# Patient Record
Sex: Female | Born: 1970
Health system: Southern US, Community
[De-identification: ages and names within clinical notes are randomized; demographics above are authoritative.]

## PROBLEM LIST (undated history)

## (undated) DIAGNOSIS — N946 Dysmenorrhea, unspecified: Secondary | ICD-10-CM

## (undated) DIAGNOSIS — G43909 Migraine, unspecified, not intractable, without status migrainosus: Secondary | ICD-10-CM

## (undated) DIAGNOSIS — M1990 Unspecified osteoarthritis, unspecified site: Secondary | ICD-10-CM

## (undated) DIAGNOSIS — K219 Gastro-esophageal reflux disease without esophagitis: Secondary | ICD-10-CM

## (undated) DIAGNOSIS — A048 Other specified bacterial intestinal infections: Secondary | ICD-10-CM

## (undated) HISTORY — DX: Other specified bacterial intestinal infections: A04.8

## (undated) HISTORY — PX: NO PAST SURGERIES: SHX2092

## (undated) HISTORY — DX: Dysmenorrhea, unspecified: N94.6

## (undated) HISTORY — DX: Gastro-esophageal reflux disease without esophagitis: K21.9

---

## 2002-05-27 ENCOUNTER — Emergency Department (HOSPITAL_COMMUNITY): Admission: EM | Admit: 2002-05-27 | Discharge: 2002-05-28 | Payer: Self-pay | Admitting: Emergency Medicine

## 2002-06-04 ENCOUNTER — Emergency Department (HOSPITAL_COMMUNITY): Admission: EM | Admit: 2002-06-04 | Discharge: 2002-06-04 | Payer: Self-pay | Admitting: Emergency Medicine

## 2002-08-22 ENCOUNTER — Ambulatory Visit (HOSPITAL_COMMUNITY): Admission: RE | Admit: 2002-08-22 | Discharge: 2002-08-22 | Payer: Self-pay | Admitting: *Deleted

## 2003-01-08 ENCOUNTER — Inpatient Hospital Stay (HOSPITAL_COMMUNITY): Admission: AD | Admit: 2003-01-08 | Discharge: 2003-01-11 | Payer: Self-pay | Admitting: Family Medicine

## 2011-01-23 NOTE — Op Note (Signed)
   NAMELEYANNA, BITTMAN                         ACCOUNT NO.:  [id number]   MEDICAL RECORD NO.:  [id number]                   PATIENT TYPE:  INP   LOCATION:  9163                                 FACILITY:  [facility]   PHYSICIAN:  Phil D. Okey Dupre, M.D.                  DATE OF BIRTH:  1970-12-21   DATE OF PROCEDURE:  01/09/2003  DATE OF DISCHARGE:                                 OPERATIVE REPORT   PROCEDURE:  Outlet forceps delivery following assisted vacuum.   PREOPERATIVE DIAGNOSES:  Nonreassuring fetal heart pattern with tachycardia  in febrile patient.   POSTOPERATIVE DIAGNOSES:  Nonreassuring fetal heart pattern with tachycardia  in febrile patient.   REASON FOR DELIVERY:  After two and a half hours of second stage labor the  patient unable to bring the head down anymore at an ROT presentation at a +2  station with complete dilatation the vacuum extractor was placed just ahead  of the occiput posterior fontanelle and during contractions traction was  placed on which brought the head down in a transverse presentation but just  at the outlet continually popped off.  The head stayed in that position.  Luikart forceps were applied and the baby was turned to an ROA presentation  and easily delivered over a midline episiotomy which was repaired with a 2-0  chromic running suture.                                               Phil D. Okey Dupre, M.D.    PDR/MEDQ  D:  01/09/2003  T:  01/09/2003  Job:  [id number]

## 2011-02-13 ENCOUNTER — Emergency Department (HOSPITAL_BASED_OUTPATIENT_CLINIC_OR_DEPARTMENT_OTHER)
Admission: EM | Admit: 2011-02-13 | Discharge: 2011-02-13 | Disposition: A | Discharge status: Home / Self Care | Payer: Self-pay | Attending: Emergency Medicine | Admitting: Emergency Medicine

## 2011-02-13 DIAGNOSIS — R55 Syncope and collapse: Secondary | ICD-10-CM | POA: Insufficient documentation

## 2011-02-13 DIAGNOSIS — R42 Dizziness and giddiness: Secondary | ICD-10-CM | POA: Insufficient documentation

## 2011-02-13 LAB — URINALYSIS, ROUTINE W REFLEX MICROSCOPIC
Bilirubin Urine: NEGATIVE
Glucose, UA: NEGATIVE mg/dL
Hgb urine dipstick: NEGATIVE
Ketones, ur: >80 mg/dL — AB
Leukocytes, UA: NEGATIVE
Nitrite: NEGATIVE
Protein, ur: NEGATIVE mg/dL
Specific Gravity, Urine: 1.018 (ref 1.005–1.030)
Urobilinogen, UA: 0.2 mg/dL (ref 0.0–1.0)
pH: 7 (ref 5.0–8.0)

## 2011-02-13 LAB — BASIC METABOLIC PANEL
BUN: 8 mg/dL (ref 6–23)
CO2: 26 mEq/L (ref 19–32)
Calcium: 9.5 mg/dL (ref 8.4–10.5)
Chloride: 100 mEq/L (ref 96–112)
Creatinine, Ser: 0.5 mg/dL (ref 0.4–1.2)
GFR calc Af Amer: >60 mL/min (ref >60)
GFR calc non Af Amer: >60 mL/min (ref >60)
Glucose, Bld: 162 mg/dL — ABNORMAL HIGH (ref 70–99)
Potassium: 3.4 mEq/L — ABNORMAL LOW (ref 3.5–5.1)
Sodium: 135 mEq/L (ref 135–145)

## 2011-02-13 LAB — PREGNANCY, URINE: Preg Test, Ur: NEGATIVE

## 2011-04-08 ENCOUNTER — Encounter: Payer: Self-pay | Admitting: Advanced Practice Midwife

## 2011-04-08 ENCOUNTER — Ambulatory Visit (INDEPENDENT_AMBULATORY_CARE_PROVIDER_SITE_OTHER): Payer: Self-pay | Admitting: Advanced Practice Midwife

## 2011-04-08 DIAGNOSIS — N949 Unspecified condition associated with female genital organs and menstrual cycle: Secondary | ICD-10-CM

## 2011-04-08 DIAGNOSIS — N946 Dysmenorrhea, unspecified: Secondary | ICD-10-CM | POA: Insufficient documentation

## 2011-04-08 DIAGNOSIS — R102 Pelvic and perineal pain: Secondary | ICD-10-CM

## 2011-04-08 DIAGNOSIS — N938 Other specified abnormal uterine and vaginal bleeding: Secondary | ICD-10-CM

## 2011-04-08 LAB — TSH: TSH: 1.737 u[IU]/mL (ref 0.350–4.500)

## 2011-04-08 LAB — LUTEINIZING HORMONE: LH: 12.4 m[IU]/mL

## 2011-04-08 NOTE — Progress Notes (Signed)
Per Diane Day RN, hospital interpreter Rosey Bath used for check in and MD visit.

## 2011-04-08 NOTE — Patient Instructions (Signed)
It was nice to meet you.  I am sorry you have been having so much pain and irregular periods.  I am going to check some blood tests to make sure your hormone levels are normal.  Also, I am going to order an [place] of your ovaries and uterus.

## 2011-04-08 NOTE — Progress Notes (Addendum)
Subjective:     Sarah Andrews is a 40 y.o. woman who presents for irregular menses. Patient's last menstrual period was 03/26/2011. Periods are irregular, lasting various different lengths of days. Dysmenorrhea:severe, occurring throughout cycle. Cyclic symptoms include: none. Current contraception: none.History of infertility: yes - Pt has been trying to get pregnant for 2 years. History of abnormal Pap smear: no.  She complains that along with the irregular bleeding she has significant pelvic pain.  She says she has had all STD and pap smear testing already done at the [facility], all normal.  She says that the pain is not associated with her cycle, it can be at any time.  It happens nearly daily.  She says if feels like a stabbing or pulling pain.  She says it hurts with sneezing.  She has had constipation and bloating, but no other associate symptoms  The following portions of the patient's history were reviewed and updated as appropriate: allergies, current medications, past family history, past medical history, past social history, past surgical history and problem list.  Review of Systems Pertinent items are noted in HPI.     Objective:    BP 107/71  Pulse 72  Temp(Src) 97.8 F (36.6 C) (Oral)  Resp 20  Ht 5\' 2"  (1.575 m)  Wt 129 lb 1.6 oz (58.559 kg)  BMI 23.61 kg/m2  LMP 03/26/2011 General appearance: alert, cooperative and no distress Lungs: clear to auscultation bilaterally Heart: regular rate and rhythm, S1, S2 normal, no murmur, click, rub or gallop Abdomen: Soft, + BS, mild TTP, no organomegaly or masses.  Pelvic: cervix normal in appearance, external genitalia normal, no adnexal masses or tenderness, rectovaginal septum normal, uterus normal size, shape, and consistency, vagina normal without discharge and Mild cystocele and tenderness with internal examination.  Extremities: extremities normal, atraumatic, no cyanosis or edema Neurologic: Grossly normal    Assessment:    The  patient has Dysfunctional Uterine Bleeding and Pelvic pain.    Plan:   Will obtain TSH, FSH, and LH Order Abdominal US to rule out fibroids, ovarian masses.  Pt declines contraception for symptom control as she wishes to become pregnant.   Pt was seen by intern under my supervision, agree with assessment and plan

## 2011-04-09 ENCOUNTER — Ambulatory Visit (HOSPITAL_COMMUNITY)
Admission: RE | Admit: 2011-04-09 | Discharge: 2011-04-09 | Disposition: A | Discharge status: Home / Self Care | Payer: Self-pay | Source: Ambulatory Visit | Attending: Obstetrics and Gynecology | Admitting: Obstetrics and Gynecology

## 2011-04-09 DIAGNOSIS — R102 Pelvic and perineal pain: Secondary | ICD-10-CM

## 2011-04-09 DIAGNOSIS — N8300 Follicular cyst of ovary, unspecified side: Secondary | ICD-10-CM | POA: Insufficient documentation

## 2011-04-09 DIAGNOSIS — N949 Unspecified condition associated with female genital organs and menstrual cycle: Secondary | ICD-10-CM | POA: Insufficient documentation

## 2011-04-09 DIAGNOSIS — N938 Other specified abnormal uterine and vaginal bleeding: Secondary | ICD-10-CM

## 2011-04-21 ENCOUNTER — Telehealth: Payer: Self-pay | Admitting: *Deleted

## 2011-04-21 NOTE — Telephone Encounter (Signed)
Message left by Tobi Bastos RN from [facility] that patient had called there to get her [place] results info.

## 2011-04-22 NOTE — Telephone Encounter (Signed)
Called pt with [facility]- interpreter. Pt was informed that her pelvic US was normal. Pt asked why she could not get pregnant. I responded that she would need to speak to the doctor further about that and I could not answer her question.pt was informed that she could make an appt @ our infertility clinic if she desired. She was informed of $200 pmt @ first visit, $100 @ each successive visit and would be billed for any remaining balance. Pt voiced understanding and had no further questions.

## 2011-11-10 ENCOUNTER — Emergency Department (HOSPITAL_COMMUNITY)
Admission: EM | Admit: 2011-11-10 | Discharge: 2011-11-11 | Disposition: A | Discharge status: Home / Self Care | Payer: Self-pay | Attending: Emergency Medicine | Admitting: Emergency Medicine

## 2011-11-10 ENCOUNTER — Encounter (HOSPITAL_COMMUNITY): Payer: Self-pay | Admitting: *Deleted

## 2011-11-10 ENCOUNTER — Emergency Department (HOSPITAL_COMMUNITY): Payer: Self-pay

## 2011-11-10 DIAGNOSIS — O4690 Antepartum hemorrhage, unspecified, unspecified trimester: Secondary | ICD-10-CM | POA: Insufficient documentation

## 2011-11-10 DIAGNOSIS — N949 Unspecified condition associated with female genital organs and menstrual cycle: Secondary | ICD-10-CM | POA: Insufficient documentation

## 2011-11-10 DIAGNOSIS — O200 Threatened abortion: Secondary | ICD-10-CM | POA: Insufficient documentation

## 2011-11-10 LAB — URINE MICROSCOPIC-ADD ON

## 2011-11-10 LAB — URINALYSIS, ROUTINE W REFLEX MICROSCOPIC
Nitrite: NEGATIVE
Specific Gravity, Urine: 1.028 (ref 1.005–1.030)
Urobilinogen, UA: 0.2 mg/dL (ref 0.0–1.0)
pH: 6 (ref 5.0–8.0)

## 2011-11-10 LAB — CBC
MCH: 31.7 pg (ref 26.0–34.0)
MCV: 91.5 fL (ref 78.0–100.0)
Platelets: 284 10*3/uL (ref 150–400)
RDW: 12.5 % (ref 11.5–15.5)

## 2011-11-10 LAB — PREGNANCY, URINE: Preg Test, Ur: POSITIVE — AB

## 2011-11-10 LAB — DIFFERENTIAL
Basophils Absolute: 0 10*3/uL (ref 0.0–0.1)
Eosinophils Absolute: 0.1 10*3/uL (ref 0.0–0.7)
Eosinophils Relative: 1 % (ref 0–5)

## 2011-11-10 MED ORDER — SODIUM CHLORIDE 0.9 % IV SOLN
INTRAVENOUS | Status: DC
  Administered 2011-11-10: 23:00:00 via INTRAVENOUS

## 2011-11-10 NOTE — ED Provider Notes (Signed)
History     CSN: [id number]  Arrival date & time 11/10/11  2125   First MD Initiated Contact with Patient 11/10/11 2306      Chief Complaint  Patient presents with  . Vaginal Bleeding  . Possible Pregnancy    (Consider location/radiation/quality/duration/timing/severity/associated sxs/prior treatment) Patient is a 41 y.o. female presenting with vaginal bleeding. The history is provided by the patient and the spouse. No language interpreter was used.  Vaginal Bleeding This is a new problem. The current episode started yesterday. The problem occurs constantly. The problem has not changed since onset.Pertinent negatives include no chest pain, no abdominal pain, no headaches and no shortness of breath. The symptoms are aggravated by nothing. The symptoms are relieved by nothing. She has tried nothing for the symptoms. The treatment provided no relief.  Patient reports LMP was 1/28 and was on time and she had a normal on time period in December.  About 24 hours ago started having vaginal bleeding and passage of clots.  Had a positive pregnancy test.  No f/c/r  History reviewed. No pertinent past medical history.  History reviewed. No pertinent past surgical history.  Family History  Problem Relation Age of Onset  . Kidney disease Mother     dialysis  . Hypotension Mother     History  Substance Use Topics  . Smoking status: Current Some Day Smoker -- 1 years    Types: Cigarettes  . Smokeless tobacco: Not on file  . Alcohol Use:     OB History    Grav Para Term Preterm Abortions TAB SAB Ect Mult Living   1 1 1       1       Review of Systems  Constitutional: Negative.   HENT: Negative.   Eyes: Negative.   Respiratory: Negative for shortness of breath.   Cardiovascular: Negative for chest pain.  Gastrointestinal: Negative.  Negative for abdominal pain.  Genitourinary: Positive for vaginal bleeding and pelvic pain.  Neurological: Negative for headaches.  Hematological:  Negative.   Psychiatric/Behavioral: Negative.     Allergies  Review of patient's allergies indicates no known allergies.  Home Medications   Current Outpatient Rx  Name Route Sig Dispense Refill  . FOLIC ACID 1 MG PO TABS Oral Take 1 mg by mouth daily.      BP 113/72  Pulse 98  Temp(Src) 98.2 F (36.8 C) (Oral)  Resp 20  SpO2 98%  Physical Exam  Constitutional: She is oriented to person, place, and time. She appears well-developed and well-nourished.  HENT:  Head: Normocephalic and atraumatic.  Mouth/Throat: Oropharynx is clear and moist.  Eyes: Pupils are equal, round, and reactive to light.  Neck: Normal range of motion. Neck supple.  Cardiovascular: Normal rate and regular rhythm.   Pulmonary/Chest: Effort normal and breath sounds normal.  Abdominal: Soft. Bowel sounds are normal.  Genitourinary:       Chaperone present vaginal bleeding os closed  Musculoskeletal: Normal range of motion.  Neurological: She is alert and oriented to person, place, and time.  Skin: Skin is warm and dry.  Psychiatric: She has a normal mood and affect.    ED Course  Procedures (including critical care time)  Labs Reviewed  URINALYSIS, ROUTINE W REFLEX MICROSCOPIC - Abnormal; Notable for the following:    APPearance CLOUDY (*)    Hgb urine dipstick LARGE (*)    Leukocytes, UA SMALL (*)    All other components within normal limits  PREGNANCY, URINE - Abnormal; Notable  for the following:    Preg Test, Ur POSITIVE (*)    All other components within normal limits  CBC - Abnormal; Notable for the following:    WBC 11.0 (*)    All other components within normal limits  WET PREP, GENITAL - Abnormal; Notable for the following:    WBC, Wet Prep HPF POC FEW (*)    All other components within normal limits  URINE MICROSCOPIC-ADD ON - Abnormal; Notable for the following:    Bacteria, UA FEW (*)    All other components within normal limits  DIFFERENTIAL  HCG, QUANTITATIVE, PREGNANCY    ABO/RH  GC/CHLAMYDIA PROBE AMP, GENITAL   No results found.   No diagnosis found.    MDM  Suspect miscarriage with beta of 26 and cramping and bleeding.  No need for rhogam.  Patient has an appointment later today with an OB she should keep that appointment and discuss the results with them will need follow up beta HCG every 48 hours to assess decline.  Patient and husband verbalize understanding and agree to follow up       Shirrell Solinger K Hagen Tidd-Rasch, MD 11/11/11 [phone]

## 2011-11-10 NOTE — ED Notes (Signed)
Pt in c/o vaginal bleeding and abd cramping x1 day, states she had a positive pregnancy test this week, last menstrual cycle was 1/24, pt states she is bleeding heavily and passing clots

## 2011-11-11 LAB — GC/CHLAMYDIA PROBE AMP, GENITAL: Chlamydia, DNA Probe: NEGATIVE

## 2011-11-11 LAB — POCT I-STAT, CHEM 8
BUN: 9 mg/dL (ref 6–23)
Calcium, Ion: 1.15 mmol/L (ref 1.12–1.32)
HCT: 37 % (ref 36.0–46.0)
TCO2: 23 mmol/L (ref 0–100)

## 2011-11-11 LAB — HCG, QUANTITATIVE, PREGNANCY: hCG, Beta Chain, Quant, S: 26 m[IU]/mL — ABNORMAL HIGH (ref <5)

## 2011-11-11 NOTE — ED Notes (Signed)
MD at bedside. Dr. Palumbo at bedside.  

## 2011-11-11 NOTE — ED Notes (Signed)
Patient transported to US 

## 2011-11-11 NOTE — ED Notes (Signed)
Patient Returned from US 

## 2011-11-11 NOTE — Discharge Instructions (Signed)
Amenaza de aborto (Threatened Miscarriage) La hemorragia en las primeras 20 semanas de embarazo es algo frecuente. Se denomina amenaza de aborto Es un problema del embarazo que ocurre antes de la vigsima semana y que sugiere la probabilidad de que ocurra un aborto espontneo. Generalmente esta hemorragia se detiene con reposo o con disminucin de las actividades, segn le ha sugerido el profesional que la [place], y Firefighter contina sin [employer]. Le indicarn que no [employer], no tenga orgasmos ni use tampones hasta que la autoricen. En algunos casos la amenaza de aborto progresar hasta el aborto completo o incompleto. En algunos casos puede ser necesario un tratamiento adicional, en otros casos no. Algunos abortos ocurren antes que la mujer note que no ha [employer] y de que sepa que est embarazada. Un aborto ocurre en el 15% al 20% de todos los embarazos y generalmente durante las primeras 13 semanas. En la [employer] se desconoce la causa exacta. Es Biochemist, clinical en que la naturaliza pone fin a un embarazo anormal o que no llegar a trmino. Algunas de las cosas que ponen en riesgo el embarazo son:  Los cambios hormonales.   Infeccin o tumores en el tero.   Enfermedades crnicas, por ejemplo la diabetes, especialmente si no se ha controlado.   Forma anormal del tero.   Fibromas en el tero   Crvix incompetente (es demasiado dbil para contener al beb)   El consumo de cigarrillos.   Beber alcohol en exceso. Lo mejor es abstenerse de beber alcohol durante el embarazo.   El consumo de drogas  TRATAMIENTO No es necesario Education officer, environmental un tratamiento adicional cuando el aborto es completo y todos los componentes de la concepcin (todos los tejidos) se han eliminado. Si ha eliminado tejidos, consrvelos en un recipiente y llvelos al mdico para que los evale. Si el aborto es incompleto (partes del feto o de la placenta [place]) ser necesario un  tratamiento adicional. La razn ms frecuente para Education officer, environmental un tratamiento es el sangrado (hemorragia) continuo debido a que los tejidos no se han eliminado completamente. Esto sucede cuando el aborto es incompleto. Tambin puede [employer] tejidos que no se han expulsado se infecten. El tratamiento consiste en la dilatacin y Scientific laboratory technician (remocin de los productos del embarazo que pudieran quedar en el tero). Se realizar simplemente por medio de la succin (curetaje por succin) o por un raspado simple del interior del tero. Podr llevarse a cabo en el hospital o en el consultorio del profesional. Slo se lleva a cabo cuando el profesional se asegura que no hay posibilidades de que el embarazo llegue a trmino. Esto se determina por medio del examen fsico, la prueba de Psychiatrist negativa, el recuento hormonal y el ultrasonido que confirme la muerte del feto. El aborto generalmente es una situacin emocionalmente difcil para los [place]. No es por su culpa ni la de su pareja. No ocurre por conductas inapropiadas por parte suya o de su compaero. Casi todos los abortos se producen porque el embarazo ha comenzado de un modo incorrecto. Al menos la mitad de estos embarazos presenta anormalidades cromosmicas. Casi nunca se trata de un trastorno congnito. En otros puede haber problemas de desarrollo en el feto o en la placenta. Esto no siempre se revela, an cuando se estudien los productos del aborto con el microscopio. No se sienta culpable y probablemente no podra haber evitado que esto ocurra. Si tiene [employer] debido a este problema, convrselo con Mining engineer  y solicite ayuda psicolgico antes de un nuevo embarazo. Casi siempre puede tratar de embarazarse nuevamente tan pronto el profesional la autorice. INSTRUCCIONES PARA EL CUIDADO DOMICILIARIO  El Economist reposo, segn la importancia de la hemorragia y los dolores que [place]. Probablemente slo la autorice a  levantarse para ir al bao. Tambin podr autorizarla a Building surveyor. En este momento usted Economist algunos arreglos para que otra persona se ocupe del cuidado de los nios y de otras responsabilidades adicionales.   Lleve un registro de la cantidad y la saturacin de las toallas higinicas que Landscape architect. Anote esta informacin.   NO USE TAMPONES. No se haga duchas vaginales ni tenga relaciones sexuales u orgasmos hasta que el mdico la autorice.   Puede ser que le indiquen una cita para un seguimiento en el que volvern a Development worker, community el [place] de su Psychiatrist y Chief Executive Officer repetirn las pruebas de [place]. Concurra para una nueva evaluacin dentro de 2 das y [place] de 4 a 6 semanas. Es muy importante que realice el seguimiento en el momento que le han indicado.   Si su grupo sanguneo es Rh negativo y el del padre es Rh positivo, o si no conoce el grupo sanguneo del padre, le indicarn una inyeccin (inmunoglobulina Rh) para prevenir los anticuerpos anormales que pueden desarrollarse y Audiological scientist al beb en futuros embarazos.  SOLICITE ATENCIN MDICA DE INMEDIATO SI:  Siente calambres intensos en el estmago, en la espalda o en el abdomen.   Siente un dolor intenso de comienzo sbito en la zona inferior del abdomen.   Comienza a sentir escalofros.   La temperatura se eleva por encima de 101 F (38.3 C).   Elimina cogulos o tejidos grandes. Guarde una muestra de esos tejidos para que el profesional lo inspeccione.   La hemorragia aumenta o se siente mareada, dbil o tiene episodios de desmayos.   Tiene una prdida de lquido por la vagina.   Se desmaya. No puede mover el intestino. Podra tratarse de un embarazo ectpico.  Document Released: 06/03/2005 Document Revised: 08/13/2011 [employer] Patient Information 2012 [place], [place].

## 2011-11-17 ENCOUNTER — Encounter (HOSPITAL_COMMUNITY): Payer: Self-pay

## 2011-11-17 ENCOUNTER — Inpatient Hospital Stay (HOSPITAL_COMMUNITY)
Admission: AD | Admit: 2011-11-17 | Discharge: 2011-11-17 | Disposition: A | Discharge status: Home / Self Care | Payer: Self-pay | Source: Ambulatory Visit | Attending: Obstetrics & Gynecology | Admitting: Obstetrics & Gynecology

## 2011-11-17 DIAGNOSIS — O039 Complete or unspecified spontaneous abortion without complication: Secondary | ICD-10-CM | POA: Insufficient documentation

## 2011-11-17 LAB — HCG, QUANTITATIVE, PREGNANCY: hCG, Beta Chain, Quant, S: <1 m[IU]/mL (ref <5)

## 2011-11-17 NOTE — Progress Notes (Signed)
[facility] Department of Clinical Social Work Documentation of Interpretation   I assisted __Donna RN_________________ with interpretation of ____questions__________________ for this patient. 

## 2011-11-17 NOTE — MAU Note (Signed)
Patient states she was seen at [facility] ED a few days ago and told to come to MAU today for a repeat BHCG. Patient states she has had no bleeding in 3 days and having a slight back pain.

## 2011-11-17 NOTE — MAU Provider Note (Signed)
Attestation of Attending Supervision of Advanced Practitioner: Evaluation and management procedures were performed by the PA/NP/CNM/OB Fellow under my supervision/collaboration. Chart reviewed, and agree with management and plan.  Jaynie Collins, M.D. 11/17/2011 2:43 PM

## 2011-11-17 NOTE — Discharge Instructions (Signed)
Aborto espontneo (Miscarriage) Una interrupcin temprana del embarazo o aborto involuntario (aborto espontneo) es un problema frecuente. Generalmente ocurre cuando el embarazo no se desarrolla normalmente. Es muy improbable que usted o su pareja hayan hecho algo para que sucediera esto, aunque el humo del cigarrillo, las enfermedades de transmisin sexual, la ingesta excesiva de alcohol o abuso de drogas pueden aumentar el riesgo. Otras causas son:  Anormalidades del tero.   Problemas hormonales u otros trastornos.   Traumatismos y problemas genticos (cromosmicos).  Sufrir un aborto espontneo no modifica sus probabilidades de tener un embarazo normal en el futuro. El mdico le aconsejar cundo puede tratar de quedar embarazada otra vez. DESPUS DE UN ABORTO ESPONTNEO  Un aborto espontneo es inevitable cuando hay una hemorragia abundante y continua, clicos, dilatacin del cuello del tero o se eliminan tejidos del embarazo. La hemorragia y los clicos continuarn hasta que todos los tejidos se hayan retirado del tero.   A veces el tero no elimina completamente todos los tejidos, entonces es necesario administrar medicamentos o realizar una dilatacin y curetaje para retirar los restos de tejidos del embarazo. La dilatacin y curetaje raspa o succiona los tejidos para retirarlos.   Si usted es Rh negativa, usted puede necesitar tener inmunoglobulina Rh para evitar problemas de Rh.   Le darn medicamentos para combatir infecciones si el aborto fue provocado por un germen.  INSTRUCCIONES PARA EL CUIDADO DOMICILIARIO  Deber reposar durante los siguientes 2 a 3 das.   No tome baos de inmersin ni se haga duchas vaginales y no coloque nada en la vagina incluyendo tampones.   No mantenga relaciones sexuales hasta que su mdico lo autorice.   Evite los ejercicios o las actividades extenuantes hasta que su mdico lo autorice.   Guarde cualquier secrecin vaginal que pueda parecer un  tejido. Consulte al mdico si es necesario inspeccionar ese tejido.   Si usted o su pareja sufren culpa o duelo intenso, hable con su mdico para buscar la ayuda psicolgica que los ayude a enfrentar la prdida del embarazo.   Permtase el tiempo suficiente de duelo antes de quedar embarazada nuevamente.  SOLICITE ATENCIN MDICA DE INMEDIATO SI:  Observa una secrecin vaginal anormal, abundante o con mal olor.   Siente dolor abdominal o plvico continuo.   La temperatura oral le sube a ms de 102 F (38.9 C) y no puede bajarla con medicamentos.   Siente debilidad intensa, se desmaya, o sufre vmitos.   Comienza a sentir escalofros.   Es vctima de violencia familiar.  ASEGURESE DE QUE:  Comprende estas instrucciones.   Controlar su enfermedad.   Solicitar ayuda inmediatamente si no mejora o si empeora.  Document Released: 08/24/2005 Document Revised: 08/13/2011 [employer] Patient Information 2012 [employer]. 

## 2011-11-17 NOTE — MAU Provider Note (Signed)
History     CSN: [id number]  Arrival date & time 11/17/11  1054   None     Chief Complaint  Patient presents with  . Follow-up   HPI Sarah Andrews is a 41 y.o. female who presents to MAU for follow up blood work. She was evaluated at [facility] on 3/5 and had a Bhcg of 26. Her blood type is O positive. She reports no bleeding and only mild back pain since her last visit.  No past medical history on file.  No past surgical history on file.  Family History  Problem Relation Age of Onset  . Kidney disease Mother     dialysis  . Hypotension Mother     History  Substance Use Topics  . Smoking status: Current Some Day Smoker -- 1 years    Types: Cigarettes  . Smokeless tobacco: Not on file  . Alcohol Use:     OB History    Grav Para Term Preterm Abortions TAB SAB Ect Mult Living   2 1 1       1       Review of Systems: As stated in HPI  Allergies  Review of patient's allergies indicates no known allergies.  Home Medications  No current outpatient prescriptions on file.  BP 104/73  Pulse 84  Temp(Src) 99.7 F (37.6 C) (Oral)  Resp 16  Ht 5' (1.524 m)  Wt 134 lb 12.8 oz (61.145 kg)  BMI 26.33 kg/m2  SpO2 100%  LMP 10/05/2011  Physical Exam  Constitutional: She is oriented to person, place, and time.  HENT:  Head: Normocephalic.  Neck: Neck supple.  Cardiovascular: Normal rate.   Musculoskeletal: Normal range of motion.  Neurological: She is alert and oriented to person, place, and time.  Psychiatric: She has a normal mood and affect. Her behavior is normal. Judgment and thought content normal.   Results for orders placed during the hospital encounter of 11/17/11 (from the past 24 hour(s))  HCG, QUANTITATIVE, PREGNANCY     Status: Normal   Collection Time   11/17/11 11:32 AM      Component Value Range   hCG, Beta Chain, Quant, S <1  <5 (mIU/mL)   Assessment: Complete SAB  Plan:  Follow up with GYN Clinic   Return here as needed. ED Course    Procedures   MDM

## 2011-12-12 ENCOUNTER — Encounter (HOSPITAL_COMMUNITY): Payer: Self-pay | Admitting: Emergency Medicine

## 2011-12-12 ENCOUNTER — Emergency Department (HOSPITAL_COMMUNITY)
Admission: EM | Admit: 2011-12-12 | Discharge: 2011-12-12 | Disposition: A | Discharge status: Home / Self Care | Payer: Self-pay | Attending: Emergency Medicine | Admitting: Emergency Medicine

## 2011-12-12 DIAGNOSIS — F17200 Nicotine dependence, unspecified, uncomplicated: Secondary | ICD-10-CM | POA: Insufficient documentation

## 2011-12-12 DIAGNOSIS — G43909 Migraine, unspecified, not intractable, without status migrainosus: Secondary | ICD-10-CM | POA: Insufficient documentation

## 2011-12-12 MED ORDER — SODIUM CHLORIDE 0.9 % IV BOLUS (SEPSIS)
1000.0000 mL | Freq: Once | INTRAVENOUS | Status: AC
  Administered 2011-12-12: 1000 mL via INTRAVENOUS

## 2011-12-12 MED ORDER — DIPHENHYDRAMINE HCL 50 MG/ML IJ SOLN
25.0000 mg | Freq: Once | INTRAMUSCULAR | Status: AC
  Administered 2011-12-12: 25 mg via INTRAVENOUS
  Filled 2011-12-12: qty 1

## 2011-12-12 MED ORDER — KETOROLAC TROMETHAMINE 30 MG/ML IJ SOLN
30.0000 mg | Freq: Once | INTRAMUSCULAR | Status: AC
  Administered 2011-12-12: 30 mg via INTRAVENOUS
  Filled 2011-12-12: qty 1

## 2011-12-12 MED ORDER — METOCLOPRAMIDE HCL 5 MG/ML IJ SOLN
10.0000 mg | Freq: Once | INTRAMUSCULAR | Status: AC
  Administered 2011-12-12: 10 mg via INTRAVENOUS
  Filled 2011-12-12: qty 2

## 2011-12-12 MED ORDER — DEXAMETHASONE SODIUM PHOSPHATE 10 MG/ML IJ SOLN
10.0000 mg | Freq: Once | INTRAMUSCULAR | Status: AC
Start: 2011-12-12 — End: 2011-12-12
  Administered 2011-12-12: 10 mg via INTRAVENOUS
  Filled 2011-12-12: qty 1

## 2011-12-12 NOTE — Discharge Instructions (Signed)
RESOURCE GUIDE  Dental Problems  Patients with Medicaid: [facility]                     [address]                                           Phone:  [phone]                                                  If unable to pay or uninsured, contact:  [facility] or [facility] to become qualified for the adult dental clinic.  Chronic Pain Problems Contact [facility]  [phone] Patients need to be referred by their primary care doctor.  Insufficient Money for Medicine Contact [employer] Way:  call "211" or [facility] 271-5999.  No Primary Care Doctor Call [facility]  [phone] Other agencies that provide inexpensive medical care    [facility]  [phone]    [facility]  [phone]    [facility]  [phone]    [facility]  [phone]    [facility]  [phone]    [facility]  [phone]  Substance Abuse [facility]  336-882-2125 [facility] 336-784-9470 [facility] 336-285-9073 [facility] 336-845-3988 Residential & Outpatient Substance Abuse Program  800-659-3381  Psychological Services [facility]  832-9600 [facility]  [phone] [facility]   [phone] (emergency services [phone])  Abuse/Neglect [place] [facility] Hotline (336) 641-3795 [place] [facility] Hotline 800-378-5315 (After Hours)  Emergency Shelter [facility] (336) 271-5985  [facility] (336) 275-9566 [facility] (704) 372-4663  MRSA Hotline #:   832-7006    [place] [facility]  [facility]  [employer]                           [facility] [address] [place]                     [address]         [address]  [place]                                                [place]                              [place] Phone:  [phone]                                  Phone:  [phone]                   Phone:  [phone]  [facility] Phone:  [phone]  [place] Child Abuse Hotline (336) 342-1394 (336)   161-0960 (After Hours)  Cefalea migraosa (Migraine Headache) Una cefalea migraosa es un dolor de cabeza intenso y punzante en uno de los dos lados de la cabeza. No siempre se conoce la causa exacta. Una migraa puede producirse cuando los nervios del cerebro se irritant y liberan qumicos que producen inflamacin dentro de los vasos sanguneos y Teaching laboratory technician. Muchas personas que padecen cefaleas tienen una historia familiar de migraas. Antes de sufrir una cefalea migraosa podr o no tener un aura. Un aura es un grupo de sntomas que predicen el comienzo de la cefalea. Esto puede incluir:  Cambios visuales como:   Flashes de Patent examiner.   Puntos brillantes o lneas en zig-zag.   Visin en tnel.   Sensacin de hormigueo.   Problemas para hablar.   Debilitamiento muscular.  SNTOMAS   Dolor en uno o ambos lados de la cabeza.   Dolor punzante o con pulsaciones.   Dolor que es lo suficientemente grave en intensidad como para impedir las actividades habituales.   Se agrava por cualquier actividad fsica habitual.   Nuseas (ganas de vomitar), vmitos o ambos.   Dolor ante la exposicin a luces brillantes o a ruidos fuertes o con Agricultural engineer.   Sensibilidad general a luces brillantes o a ruidos fuertes.  DISPARADORES DE LA CEFALEA MIGRAOSA Algunos factores que desencadenan la migraa son:   Neal Dy alcohol.   El hbito de fumar.   El estrs.   Pueden estar relacionadas con el perodo menstrual.   Quesos estacionados.   Alimentos o bebidas que contienen nitratos, glutamato, aspartamo o tiramina.   La falta de sueo.   Chocolate.   Cafena.   Hambre.   Medicamentos como  nitroglicerina (se utiliza para tratar dolores en el pecho), pldoras anticonceptivas, estrgenos y algunos medicamentos para la presin sangunea.  DIAGNSTICO Una cefalea migraosa a menudo se diagnostica segn:  Sntomas.   Examen fsico.   Un estudio computarizado de rayos X (tomografa computarizada) de la cabeza.  TRATAMIENTO  Hay medicamentos que pueden prevenir estas cefaleas si son recurrentes o llegan a serlo en el futuro. El profesional que lo asiste lo ayudar a Horticulturist, commercial o el programa de tratamiento que puede ser de [place] para usted.   Acostarse en una habitacin oscura y silenciosa puede ser til.   Llevar un registro de los dolores de cabeza puede ayudarlo a Building services engineer los desencadenantes del Engineer, mining.  SOLICITE ATENCIN MDICA DE INMEDIATO SI:   Confusin, cambios en la personalidad que no son habituales, temblores.   El dolor lo despierta por las noches.   Presenta un aumento en la frecuencia de las cefaleas.   Presenta rigidez en el cuello.   Sufre prdida de la visin.   Siente debilidad muscular.   Comienza a perder el equilibrio o tiene problemas para caminar.   Sufre mareos o se desmaya.  ASEGRESE QUE:   Comprende estas instrucciones.   Controlar su enfermedad.   Solicitar ayuda de inmediato si no mejora o empeora.  Document Released: 08/24/2005 Document Revised: 08/13/2011 [facility] Patient Information 2012 [place], [place].

## 2011-12-12 NOTE — ED Notes (Addendum)
Pt c/o headache, dizziness & nase[place] X 3 days. Pt stated "she took ibuprofen and had no relief". Pt stated she often gets migraines around the same time as her period, she started her period yesterday.

## 2011-12-12 NOTE — ED Provider Notes (Signed)
History     CSN: [id number]  Arrival date & time 12/12/11  1209   First MD Initiated Contact with Patient 12/12/11 1236      HPI Patient reports migraine headaches that occur at the beginning of her periods. States most recently has developed 3 days ago at the start of her period this month. Describes pain as a throbbing pain across her forehead it in the back of her head. States pain is typical for her monthly headaches. Associated with dizziness, nausea, photophobia, phonophobia. Denies fever, sore throat, nasal congestion, neck pain, change in vision. Reports been able to control pain with ibuprofen. Patient is a 41 y.o. female presenting with migraine. The history is provided by the patient.  Migraine This is a recurrent problem. Episode onset: 3 days ago. The problem occurs constantly. The problem has been gradually worsening. Associated symptoms include headaches and nausea. Pertinent negatives include no abdominal pain, chest pain, chills, congestion, coughing, fatigue, fever, neck pain, numbness, rash, sore throat, vertigo, visual change, vomiting or weakness. Exacerbated by: bright lights and loud sounds. She has tried NSAIDs for the symptoms. The treatment provided no relief.    History reviewed. No pertinent past medical history.  History reviewed. No pertinent past surgical history.  Family History  Problem Relation Age of Onset  . Kidney disease Mother     dialysis  . Hypotension Mother     History  Substance Use Topics  . Smoking status: Current Some Day Smoker -- 1 years    Types: Cigarettes  . Smokeless tobacco: Not on file  . Alcohol Use:     OB History    Grav Para Term Preterm Abortions TAB SAB Ect Mult Living   2 1 1       1       Review of Systems  Constitutional: Negative for fever, chills and fatigue.  HENT: Negative for congestion, sore throat, rhinorrhea, trouble swallowing, neck pain, neck stiffness, postnasal drip and sinus pressure.   Respiratory:  Negative for cough and shortness of breath.   Cardiovascular: Negative for chest pain and palpitations.  Gastrointestinal: Positive for nausea. Negative for vomiting and abdominal pain.  Musculoskeletal: Negative for back pain.  Skin: Negative for rash.  Neurological: Positive for dizziness and headaches. Negative for vertigo, seizures, speech difficulty, weakness, light-headedness and numbness.  All other systems reviewed and are negative.    Allergies  Review of patient's allergies indicates no known allergies.  Home Medications   Current Outpatient Rx  Name Route Sig Dispense Refill  . IBUPROFEN 200 MG PO TABS Oral Take 400 mg by mouth every 6 (six) hours as needed. For headache.    Marland Kitchen FOLIC ACID 1 MG PO TABS Oral Take 1 mg by mouth daily.      BP 103/57  Pulse 72  Temp(Src) 98.1 F (36.7 C) (Oral)  Resp 20  Wt 134 lb (60.782 kg)  SpO2 99%  LMP 12/11/2011  Breastfeeding? Unknown  Physical Exam  Vitals reviewed. Constitutional: She is oriented to person, place, and time. Vital signs are normal. She appears well-developed and well-nourished. No distress.  HENT:  Head: Normocephalic and atraumatic.  Right Ear: Hearing, tympanic membrane, external ear and ear canal normal.  Left Ear: Hearing, tympanic membrane, external ear and ear canal normal.  Nose: Nose normal.  Mouth/Throat: Uvula is midline, oropharynx is clear and moist and mucous membranes are normal. No oropharyngeal exudate.  Eyes: Conjunctivae and EOM are normal. Pupils are equal, round, and reactive to light.  Fundoscopic exam:      The right eye shows no hemorrhage.       The left eye shows no hemorrhage.  Neck: Normal range of motion. Neck supple. No spinous process tenderness and no muscular tenderness present. No rigidity. No Brudzinski's sign and no Kernig's sign noted.  Pulmonary/Chest: Effort normal.  Neurological: She is alert and oriented to person, place, and time. She has normal strength. No cranial  nerve deficit or sensory deficit. She displays a negative Romberg sign. Coordination normal.  Skin: Skin is warm and dry. No rash noted. No erythema. No pallor.  Psychiatric: She has a normal mood and affect. Her behavior is normal.    ED Course  Procedures    MDM   Patient reports pain is now a 0/10. Advised advil at home for additional relief if needed. Pt and family do not have any questions and are ready for d/c       Thomasene Lot, PA-C 12/12/11 1504

## 2011-12-12 NOTE — ED Provider Notes (Signed)
Medical screening examination/treatment/procedure(s) were performed by non-physician practitioner and as supervising physician I was immediately available for consultation/collaboration.   Lyanne Co, MD 12/12/11 [phone]

## 2013-11-30 ENCOUNTER — Emergency Department (HOSPITAL_COMMUNITY)
Admission: EM | Admit: 2013-11-30 | Discharge: 2013-12-01 | Disposition: A | Discharge status: Home / Self Care | Payer: Self-pay | Attending: Emergency Medicine | Admitting: Emergency Medicine

## 2013-11-30 ENCOUNTER — Emergency Department (HOSPITAL_COMMUNITY): Admission: EM | Admit: 2013-11-30 | Discharge: 2013-11-30 | Discharge status: Against Medical Advice | Payer: Self-pay

## 2013-11-30 ENCOUNTER — Encounter (HOSPITAL_COMMUNITY): Payer: Self-pay | Admitting: Emergency Medicine

## 2013-11-30 DIAGNOSIS — Z87891 Personal history of nicotine dependence: Secondary | ICD-10-CM | POA: Insufficient documentation

## 2013-11-30 DIAGNOSIS — K529 Noninfective gastroenteritis and colitis, unspecified: Secondary | ICD-10-CM

## 2013-11-30 DIAGNOSIS — K5289 Other specified noninfective gastroenteritis and colitis: Secondary | ICD-10-CM | POA: Insufficient documentation

## 2013-11-30 DIAGNOSIS — Z3202 Encounter for pregnancy test, result negative: Secondary | ICD-10-CM | POA: Insufficient documentation

## 2013-11-30 DIAGNOSIS — Z79899 Other long term (current) drug therapy: Secondary | ICD-10-CM | POA: Insufficient documentation

## 2013-11-30 LAB — COMPREHENSIVE METABOLIC PANEL
ALT: 22 U/L (ref 0–35)
AST: 24 U/L (ref 0–37)
Albumin: 3.9 g/dL (ref 3.5–5.2)
Alkaline Phosphatase: 64 U/L (ref 39–117)
BUN: 8 mg/dL (ref 6–23)
CO2: 19 mEq/L (ref 19–32)
Calcium: 8.8 mg/dL (ref 8.4–10.5)
Chloride: 102 mEq/L (ref 96–112)
Creatinine, Ser: 0.5 mg/dL (ref 0.50–1.10)
GFR calc Af Amer: >90 mL/min (ref >90)
GFR calc non Af Amer: >90 mL/min (ref >90)
Glucose, Bld: 142 mg/dL — ABNORMAL HIGH (ref 70–99)
Potassium: 3 mEq/L — ABNORMAL LOW (ref 3.7–5.3)
Sodium: 136 mEq/L — ABNORMAL LOW (ref 137–147)
Total Bilirubin: 0.3 mg/dL (ref 0.3–1.2)
Total Protein: 8 g/dL (ref 6.0–8.3)

## 2013-11-30 LAB — CBC WITH DIFFERENTIAL/PLATELET
Basophils Absolute: 0 10*3/uL (ref 0.0–0.1)
Basophils Relative: 0 % (ref 0–1)
Eosinophils Absolute: 0 10*3/uL (ref 0.0–0.7)
Eosinophils Relative: 0 % (ref 0–5)
HCT: 38.7 % (ref 36.0–46.0)
Hemoglobin: 13.6 g/dL (ref 12.0–15.0)
Lymphocytes Relative: 10 % — ABNORMAL LOW (ref 12–46)
Lymphs Abs: 0.9 10*3/uL (ref 0.7–4.0)
MCH: 31.7 pg (ref 26.0–34.0)
MCHC: 35.1 g/dL (ref 30.0–36.0)
MCV: 90.2 fL (ref 78.0–100.0)
Monocytes Absolute: 0.9 10*3/uL (ref 0.1–1.0)
Monocytes Relative: 11 % (ref 3–12)
Neutro Abs: 6.7 10*3/uL (ref 1.7–7.7)
Neutrophils Relative %: 79 % — ABNORMAL HIGH (ref 43–77)
Platelets: 238 10*3/uL (ref 150–400)
RBC: 4.29 MIL/uL (ref 3.87–5.11)
RDW: 12.6 % (ref 11.5–15.5)
WBC: 8.6 10*3/uL (ref 4.0–10.5)

## 2013-11-30 MED ORDER — ONDANSETRON HCL 4 MG/2ML IJ SOLN
4.0000 mg | Freq: Once | INTRAMUSCULAR | Status: AC
  Administered 2013-11-30: 4 mg via INTRAVENOUS
  Filled 2013-11-30: qty 2

## 2013-11-30 MED ORDER — SODIUM CHLORIDE 0.9 % IV BOLUS (SEPSIS)
2000.0000 mL | Freq: Once | INTRAVENOUS | Status: AC
  Administered 2013-11-30: 2000 mL via INTRAVENOUS

## 2013-11-30 NOTE — ED Notes (Signed)
Pt has family at bedside that translates  Pt states yesterday morning she had diarrhea that continued all day and today she has diarrhea and vomiting  Pt is c/o aching all over and feeling weak and tired

## 2013-12-01 LAB — URINALYSIS, ROUTINE W REFLEX MICROSCOPIC
Bilirubin Urine: NEGATIVE
Glucose, UA: NEGATIVE mg/dL
Ketones, ur: NEGATIVE mg/dL
Nitrite: NEGATIVE
Protein, ur: NEGATIVE mg/dL
Specific Gravity, Urine: 1.002 — ABNORMAL LOW (ref 1.005–1.030)
Urobilinogen, UA: 0.2 mg/dL (ref 0.0–1.0)
pH: 6.5 (ref 5.0–8.0)

## 2013-12-01 LAB — URINE MICROSCOPIC-ADD ON

## 2013-12-01 LAB — PREGNANCY, URINE: Preg Test, Ur: NEGATIVE

## 2013-12-01 MED ORDER — POTASSIUM CHLORIDE 10 MEQ/100ML IV SOLN
10.0000 meq | INTRAVENOUS | Status: AC
  Administered 2013-12-01 (×2): 10 meq via INTRAVENOUS
  Filled 2013-12-01 (×2): qty 100

## 2013-12-01 MED ORDER — PROMETHAZINE HCL 25 MG PO TABS: 25.0000 mg | ORAL_TABLET | Freq: Three times a day (TID) | ORAL | Status: DC | PRN

## 2013-12-01 NOTE — ED Provider Notes (Signed)
Medical screening examination/treatment/procedure(s) were performed by non-physician practitioner and as supervising physician I was immediately available for consultation/collaboration.   EKG Interpretation None        Junius ArgyleForrest S Devarion Mcclanahan, MD 12/01/13 1220

## 2013-12-01 NOTE — Discharge Instructions (Signed)
Return here as needed.  Followup with your primary care Dr. slowly increase your fluid intake.  Rest as much as possible

## 2013-12-01 NOTE — ED Provider Notes (Signed)
CSN: [id number]     Arrival date & time 11/30/13  2128 History   First MD Initiated Contact with Patient 11/30/13 2258     Chief Complaint  Patient presents with  . Diarrhea  . Emesis     (Consider location/radiation/quality/duration/timing/severity/associated sxs/prior Treatment) HPI Patient presents emergency department with nausea, vomiting, and diarrhea that started last night.  The patient started with diarrhea last night and then began vomiting early this morning.  Patient has a generalized weak feeling.  The patient denies chest pain, shortness of breath, abdominal pain, dizziness, fever, back pain, dysuria, headache, blurred vision, rash, or syncope.  Patient, states nothing seems to make her condition, better or worse.  Patient has been drinking small sips of Gatorade all day History reviewed. No pertinent past medical history. History reviewed. No pertinent past surgical history. Family History  Problem Relation Age of Onset  . Kidney disease Mother     dialysis  . Hypotension Mother   . Diabetes Father    History  Substance Use Topics  . Smoking status: Former Smoker -- 1 years  . Smokeless tobacco: Not on file  . Alcohol Use: Yes     Comment: occ   OB History   Grav Para Term Preterm Abortions TAB SAB Ect Mult Living   2 1 1       1      Review of Systems  All other systems negative except as documented in the HPI. All pertinent positives and negatives as reviewed in the HPI.  Allergies  Review of patient's allergies indicates no known allergies.  Home Medications   Current Outpatient Rx  Name  Route  Sig  Dispense  Refill  . lactobacillus acidophilus (BACID) TABS tablet   Oral   Take 1 tablet by mouth daily.         . metoCLOPramide (REGLAN) 5 MG tablet   Oral   Take 5 mg by mouth 4 (four) times daily.          BP 121/72  Pulse 102  Temp(Src) 98.5 F (36.9 C) (Oral)  Resp 20  SpO2 99%  LMP 11/11/2013 Physical Exam  Nursing note and vitals  reviewed. Constitutional: She is oriented to person, place, and time. She appears well-developed and well-nourished. No distress.  HENT:  Head: Normocephalic and atraumatic.  Mouth/Throat: Oropharynx is clear and moist.  Eyes: Pupils are equal, round, and reactive to light.  Neck: Normal range of motion. Neck supple.  Cardiovascular: Normal rate, regular rhythm and normal heart sounds.  Exam reveals no gallop and no friction rub.   No murmur heard. Pulmonary/Chest: Effort normal and breath sounds normal. No respiratory distress.  Abdominal: Soft. Bowel sounds are normal. She exhibits no distension. There is no tenderness. There is no guarding.  Neurological: She is alert and oriented to person, place, and time. She exhibits normal muscle tone. Coordination normal.  Skin: Skin is warm and dry.    ED Course  Procedures (including critical care time) Labs Review Labs Reviewed  CBC WITH DIFFERENTIAL - Abnormal; Notable for the following:    Neutrophils Relative % 79 (*)    Lymphocytes Relative 10 (*)    All other components within normal limits  COMPREHENSIVE METABOLIC PANEL - Abnormal; Notable for the following:    Sodium 136 (*)    Potassium 3.0 (*)    Glucose, Bld 142 (*)    All other components within normal limits  URINALYSIS, ROUTINE W REFLEX MICROSCOPIC - Abnormal; Notable for the  following:    Specific Gravity, Urine 1.002 (*)    Hgb urine dipstick SMALL (*)    Leukocytes, UA SMALL (*)    All other components within normal limits  URINE CULTURE  PREGNANCY, URINE  URINE MICROSCOPIC-ADD ON   Patient be treated for gastroenteritis, based on her history of present illness and physical exam findings.  Patient has tolerated oral fluids without difficulty.  She is given IV potassium and 2 L of IV fluid.  Patient is given a plan and all questions were answered.  Patient voices an understanding of the plan    Carlyle DollyChristopher W Micahel Omlor, PA-C 12/01/13 0117

## 2013-12-01 NOTE — ED Notes (Signed)
Pt given PO fluids.

## 2013-12-02 LAB — URINE CULTURE: Colony Count: >=100000

## 2014-07-09 ENCOUNTER — Encounter (HOSPITAL_COMMUNITY): Payer: Self-pay | Admitting: Emergency Medicine

## 2014-09-03 ENCOUNTER — Ambulatory Visit: Payer: Self-pay

## 2014-10-16 ENCOUNTER — Other Ambulatory Visit (HOSPITAL_COMMUNITY): Payer: Self-pay | Admitting: Urology

## 2014-10-16 DIAGNOSIS — Z1231 Encounter for screening mammogram for malignant neoplasm of breast: Secondary | ICD-10-CM

## 2014-11-01 ENCOUNTER — Ambulatory Visit (HOSPITAL_COMMUNITY)
Admission: RE | Admit: 2014-11-01 | Discharge: 2014-11-01 | Disposition: A | Discharge status: Home / Self Care | Payer: Self-pay | Source: Ambulatory Visit | Attending: Urology | Admitting: Urology

## 2014-11-01 DIAGNOSIS — Z1231 Encounter for screening mammogram for malignant neoplasm of breast: Secondary | ICD-10-CM

## 2014-11-20 ENCOUNTER — Encounter: Payer: Self-pay | Admitting: Obstetrics & Gynecology

## 2014-12-04 ENCOUNTER — Encounter: Payer: Self-pay | Admitting: *Deleted

## 2014-12-05 ENCOUNTER — Encounter (HOSPITAL_COMMUNITY): Payer: Self-pay | Admitting: *Deleted

## 2014-12-05 ENCOUNTER — Emergency Department (HOSPITAL_COMMUNITY)
Admission: EM | Admit: 2014-12-05 | Discharge: 2014-12-05 | Disposition: A | Discharge status: Home / Self Care | Payer: Self-pay | Attending: Emergency Medicine | Admitting: Emergency Medicine

## 2014-12-05 DIAGNOSIS — Z87891 Personal history of nicotine dependence: Secondary | ICD-10-CM | POA: Insufficient documentation

## 2014-12-05 DIAGNOSIS — G43809 Other migraine, not intractable, without status migrainosus: Secondary | ICD-10-CM | POA: Insufficient documentation

## 2014-12-05 DIAGNOSIS — Z79899 Other long term (current) drug therapy: Secondary | ICD-10-CM | POA: Insufficient documentation

## 2014-12-05 MED ORDER — METOCLOPRAMIDE HCL 5 MG/ML IJ SOLN
10.0000 mg | Freq: Once | INTRAMUSCULAR | Status: AC
  Administered 2014-12-05: 10 mg via INTRAVENOUS
  Filled 2014-12-05: qty 2

## 2014-12-05 MED ORDER — BUTALBITAL-APAP-CAFFEINE 50-325-40 MG PO TABS: 1.0000 | ORAL_TABLET | Freq: Four times a day (QID) | ORAL | Status: AC | PRN

## 2014-12-05 MED ORDER — ONDANSETRON 4 MG PO TBDP
8.0000 mg | ORAL_TABLET | Freq: Once | ORAL | Status: AC
  Administered 2014-12-05: 8 mg via ORAL

## 2014-12-05 MED ORDER — OXYCODONE-ACETAMINOPHEN 5-325 MG PO TABS
ORAL_TABLET | ORAL | Status: AC
  Administered 2014-12-05: 1 via ORAL
  Filled 2014-12-05: qty 1

## 2014-12-05 MED ORDER — ONDANSETRON 4 MG PO TBDP
ORAL_TABLET | ORAL | Status: AC
  Filled 2014-12-05: qty 2

## 2014-12-05 MED ORDER — OXYCODONE-ACETAMINOPHEN 5-325 MG PO TABS
1.0000 | ORAL_TABLET | Freq: Once | ORAL | Status: AC
  Administered 2014-12-05: 1 via ORAL

## 2014-12-05 MED ORDER — SODIUM CHLORIDE 0.9 % IV BOLUS (SEPSIS)
1000.0000 mL | Freq: Once | INTRAVENOUS | Status: AC
  Administered 2014-12-05: 1000 mL via INTRAVENOUS

## 2014-12-05 MED ORDER — DIPHENHYDRAMINE HCL 50 MG/ML IJ SOLN
12.5000 mg | Freq: Once | INTRAMUSCULAR | Status: AC
  Administered 2014-12-05: 12.5 mg via INTRAVENOUS
  Filled 2014-12-05: qty 1

## 2014-12-05 NOTE — Discharge Instructions (Signed)
Cefalea migrañosa °(Migraine Headache) °Una cefalea migrañosa es un dolor muy intenso y punzante en uno o ambos lados de la cabeza. Hable con su médico sobre los factores que pueden causar (desencadenar) las cefaleas migrañosas. °CUIDADOS EN EL HOGAR °· Tome solo los medicamentos según le haya indicado el médico. °· Cuando tenga la migraña, acuéstese en un cuarto oscuro y tranquilo °· Lleve un registro diario para averiguar si hay ciertas cosas que le provocan la cefalea migrañosa. Por ejemplo, escriba: °¨ Lo que usted come y bebe. °¨ Cuánto tiempo duerme. °¨ Algún cambio en su dieta o en los medicamentos. °· Beba menos alcohol. °· Si fuma, deje de hacerlo. °· Duerma lo suficiente. °· Disminuya todo tipo de estrés de la vida diaria. °· Mantenga las luces tenues si le molestan las luces brillantes o hacen que la migraña empeore. °SOLICITE AYUDA DE INMEDIATO SI:  °· La migraña empeora. °· Tiene fiebre. °· Presenta rigidez en el cuello. °· Tiene dificultad para ver. °· Sus músculos están débiles, o pierde el control muscular. °· Pierde el equilibrio o tiene problemas para caminar. °· Siente que se desvanece (debilidad) o se desmaya. °· Tiene malos síntomas que son diferentes a los primeros síntomas. °ASEGÚRESE DE QUE:  °· Comprende estas instrucciones. °· Controlará su afección. °· Recibirá ayuda de inmediato si no mejora o si empeora. °Document Released: 11/20/2008 Document Revised: 08/29/2013 °ExitCare® Patient Information ©2015 [employer]. This information is not intended to replace advice given to you by your health care provider. Make sure you discuss any questions you have with your health care provider. ° °

## 2014-12-05 NOTE — ED Notes (Signed)
Pt made aware to return if symptoms worsen or if any life threatening symptoms occur.   

## 2014-12-05 NOTE — ED Provider Notes (Signed)
CSN: [id number]     Arrival date & time 12/05/14  1735 History   First MD Initiated Contact with Patient 12/05/14 1825     Chief Complaint  Patient presents with  . Migraine    HPI Patient presents to the emergency room with complaints of a migraine headache. She has a history of migraines in the past. Today she started developing a similar headache. She's had some nausea and vomiting. The headache is exacerbated by light. She denies any fevers or chills. No neck pain. No numbness or weakness. History reviewed. No pertinent past medical history. History reviewed. No pertinent past surgical history. Family History  Problem Relation Age of Onset  . Kidney disease Mother     dialysis  . Hypotension Mother   . Diabetes Father    History  Substance Use Topics  . Smoking status: Former Smoker -- 1 years  . Smokeless tobacco: Not on file  . Alcohol Use: Yes     Comment: occ   OB History    Gravida Para Term Preterm AB TAB SAB Ectopic Multiple Living   2 1 1       1      Review of Systems  All other systems reviewed and are negative.     Allergies  Review of patient's allergies indicates no known allergies.  Home Medications   Prior to Admission medications   Medication Sig Start Date End Date Taking? Authorizing Provider  acidophilus (RISAQUAD) CAPS capsule Take 1 capsule by mouth daily.    Historical Provider, MD  butalbital-acetaminophen-caffeine (FIORICET) [phone] MG per tablet Take 1-2 tablets by mouth every 6 (six) hours as needed for headache. 12/05/14 12/05/15  Linwood DibblesJon Param Capri, MD  lactobacillus acidophilus (BACID) TABS tablet Take 1 tablet by mouth daily.    Historical Provider, MD  metoCLOPramide (REGLAN) 5 MG tablet Take 5 mg by mouth 4 (four) times daily.    Historical Provider, MD  metoCLOPramide (REGLAN) 5 MG tablet Take 5 mg by mouth 4 (four) times daily.    Historical Provider, MD  promethazine (PHENERGAN) 25 MG tablet Take 1 tablet (25 mg total) by mouth every 8  (eight) hours as needed for nausea or vomiting. 12/01/13   Charlestine Nighthristopher Lawyer, PA-C   BP 115/66 mmHg  Pulse 82  Temp(Src) 97.7 F (36.5 C)  Resp 16  SpO2 99% Physical Exam  Constitutional: No distress.  HENT:  Head: Normocephalic and atraumatic.  Right Ear: External ear normal.  Left Ear: External ear normal.  Eyes: Conjunctivae are normal. Right eye exhibits no discharge. Left eye exhibits no discharge. No scleral icterus.  Neck: Neck supple. No tracheal deviation present.  Cardiovascular: Normal rate, regular rhythm and intact distal pulses.   Pulmonary/Chest: Effort normal and breath sounds normal. No stridor. No respiratory distress. She has no wheezes. She has no rales.  Abdominal: Soft. Bowel sounds are normal. She exhibits no distension. There is no tenderness. There is no rebound and no guarding.  Musculoskeletal: She exhibits no edema or tenderness.  Neurological: She is alert. She has normal strength. No cranial nerve deficit (no facial droop, extraocular movements intact, no slurred speech) or sensory deficit. She exhibits normal muscle tone. She displays no seizure activity. Coordination normal.  Skin: Skin is warm and dry. No rash noted.  Psychiatric: She has a normal mood and affect.  Nursing note and vitals reviewed.   ED Course  Procedures (including critical care time) Medications  ondansetron (ZOFRAN-ODT) disintegrating tablet 8 mg (8 mg Oral Given 12/05/14  1755)  oxyCODONE-acetaminophen (PERCOCET/ROXICET) 5-325 MG per tablet 1 tablet (1 tablet Oral Given 12/05/14 1756)  metoCLOPramide (REGLAN) injection 10 mg (10 mg Intravenous Given 12/05/14 2040)  diphenhydrAMINE (BENADRYL) injection 12.5 mg (12.5 mg Intravenous Given 12/05/14 2040)  sodium chloride 0.9 % bolus 1,000 mL (0 mLs Intravenous Stopped 12/05/14 2150)     MDM   Final diagnoses:  Other migraine without status migrainosus, not intractable   Symptoms improved with treatment.  Consistent with a migraine  type headache.  At this time there does not appear to be any evidence of an acute emergency medical condition and the patient appears stable for discharge with appropriate outpatient follow up.     Linwood Dibbles, MD 12/05/14 2153

## 2014-12-05 NOTE — ED Notes (Signed)
Pt states that she has a migraine with nausea and vomiting. Pt states that she has hx of the same. No neuro deficits at triage.

## 2014-12-28 ENCOUNTER — Ambulatory Visit: Payer: Self-pay | Admitting: Obstetrics & Gynecology

## 2015-01-23 ENCOUNTER — Encounter: Payer: Self-pay | Admitting: Obstetrics & Gynecology

## 2015-01-23 ENCOUNTER — Ambulatory Visit (INDEPENDENT_AMBULATORY_CARE_PROVIDER_SITE_OTHER): Payer: Self-pay | Admitting: Obstetrics & Gynecology

## 2015-01-23 VITALS — BP 104/76 | HR 65 | Temp 98.4°F | Resp 20 | Ht 60.0 in

## 2015-01-23 DIAGNOSIS — N946 Dysmenorrhea, unspecified: Secondary | ICD-10-CM

## 2015-01-23 MED ORDER — NAPROXEN 500 MG PO TABS: 500.0000 mg | ORAL_TABLET | Freq: Two times a day (BID) | ORAL | Status: DC

## 2015-01-23 NOTE — Progress Notes (Signed)
   CLINIC ENCOUNTER NOTE  History:  44 y.o. G3P1011 here today for discussion of management of dysmenorrhea. Patient is Spanish-speaking only, Spanish interpreter present for this encounter.  Reports debilitating pelvic pain during her menstrual periods.  Periods are regular, lasts for 7-8 days,denies heavy bleeding.  This dysmenorrhea has only been present for about one year, is worse during first few days of period, not alleviate by OTC medications, not associated with any other symptoms.  Pain never occurs outside her cycle. No abnormal vaginal discharge or other GYN concerns.  Past Medical History  Diagnosis Date  . Dysmenorrhea     History reviewed. No pertinent past surgical history.  The following portions of the patient's history were reviewed and updated as appropriate: allergies, current medications, past family history, past medical history, past social history, past surgical history and problem list.   Health Maintenance:  [facility].  Review of Systems:  Pertinent items are noted in HPI.  Also reports occasional migraines with aura. Comprehensive review of systems was otherwise negative.  Objective:  Physical Exam BP 104/76 mmHg  Pulse 65  Temp(Src) 98.4 F (36.9 C) (Oral)  Resp 20  Ht 5' (1.524 m)  LMP 12/28/2014 (Approximate) CONSTITUTIONAL: Well-developed, well-nourished female in no acute distress.  HENT:  Normocephalic, atraumatic, External right and left ear normal. Oropharynx is clear and moist EYES: Conjunctivae and EOM are normal. Pupils are equal, round, and reactive to light. No scleral icterus.  NECK: Normal range of motion, supple, no masses SKIN: Skin is warm and dry. No rash noted. Not diaphoretic. No erythema. No pallor. NEUROLGIC: Alert and oriented to person, place, and time. Normal reflexes, muscle tone coordination. No cranial nerve deficit noted. PSYCHIATRIC: Normal mood and affect. Normal behavior. Normal judgment and thought  content. CARDIOVASCULAR: Normal heart rate noted RESPIRATORY: Effort and breath sounds normal, no problems with respiration noted ABDOMEN: Soft, no distention noted.  No tenderness, rebound or guarding.  PELVIC: Normal appearing external genitalia; normal appearing vaginal mucosa and cervix. Minimal anterior vaginal prolapse noted, no intervention needed.  Normal appearing discharge.  Not able to palpate uterus well due to voluntary guarding by patient, no other palpable masses, no uterine or adnexal tenderness. MUSCULOSKELETAL: Normal range of motion. No edema and no tenderness.   Assessment & Plan:  Pelvic ultrasound ordered Naproxen ordered for pain. Discussed menstrual suppression progesterone only therapy (no estrogen due to occasional migraines with aura), patient declines this for now. Will follow up results and response of patient; she will return in 2 months for reevaluation. Routine preventative health maintenance measures emphasized.   Total face-to-face time with patient: 20 minutes. Over 50% of encounter was spent on counseling and coordination of care.   Jaynie CollinsUGONNA  Wade Sigala, MD, FACOG Attending Obstetrician & Gynecologist Center for [facility], [facility]

## 2015-01-23 NOTE — Patient Instructions (Signed)
Regrese a la clinica cuando tenga su cita. Si tiene problemas o preguntas, llama a la clinica o vaya a la sala de emergencia al [facility].    

## 2015-01-23 NOTE — Progress Notes (Signed)
Interpreter - Sylvia Sobalvarro present for encounter.   

## 2015-01-30 ENCOUNTER — Ambulatory Visit (HOSPITAL_COMMUNITY)
Admission: RE | Admit: 2015-01-30 | Discharge: 2015-01-30 | Disposition: A | Discharge status: Home / Self Care | Payer: Self-pay | Source: Ambulatory Visit | Attending: Obstetrics & Gynecology | Admitting: Obstetrics & Gynecology

## 2015-01-30 DIAGNOSIS — N946 Dysmenorrhea, unspecified: Secondary | ICD-10-CM | POA: Insufficient documentation

## 2015-01-31 ENCOUNTER — Telehealth: Payer: Self-pay | Admitting: General Practice

## 2015-01-31 ENCOUNTER — Other Ambulatory Visit: Payer: Self-pay | Admitting: General Practice

## 2015-01-31 DIAGNOSIS — N946 Dysmenorrhea, unspecified: Secondary | ICD-10-CM

## 2015-01-31 MED ORDER — NORETHINDRONE 0.35 MG PO TABS: 1.0000 | ORAL_TABLET | Freq: Every day | ORAL | Status: DC

## 2015-01-31 NOTE — Telephone Encounter (Signed)
-----   Message from Tereso NewcomerUgonna A Anyanwu, MD sent at 01/30/2015  5:09 PM EDT ----- Small 1.5 cm fibroid, otherwise normal ultrasound. Continue medication as recommended. Please call to inform patient of results and recommendations.  Spanish speaking.

## 2015-01-31 NOTE — Telephone Encounter (Addendum)
Called patient to discuss how to take micronor. Per Dr Macon LargeAnyanwu, patient is to take two packs continously (no active pills) then can switch to three weeks of active pills and one week placebo as usually prescribed. No answer- left message that we are trying to reach you about your medication, please call us back at the clinics  6/1  1030  Called pt with [employer] Interpreter # [phone] [place](Luis).  Pt was informed of administration instructions for Micronor. Pt also had questions regarding Rx which was given on 5/18 for Naproxen. She wanted to know if both medicines were for pain. I advised that they are not both pain medications - only the Naproxen. However, once her periods are suppressed from the Micronor she may likely have less pain as well as lighter menstrual flow. Pt was also advised that she needs follow up clinic appt in 2 months. She should call back in July to schedule appt in August.  Pt voiced understanding of all information and instructions given.   Diane Day RNC

## 2015-01-31 NOTE — Telephone Encounter (Signed)
[facility] and informed her of results. Patient verbalizes understanding. Asked patient if she decided if she wanted to take the medication to help suppress and control her periods. Patient states yes that's fine. Told patient she can go by her pharmacy later today to pick the medication up. Patient verbalized understanding to all and had no other questions. Will call Dr Macon LargeAnyanwu about Rx.

## 2015-12-18 ENCOUNTER — Encounter (HOSPITAL_COMMUNITY): Payer: Self-pay | Admitting: Emergency Medicine

## 2015-12-18 DIAGNOSIS — Y9389 Activity, other specified: Secondary | ICD-10-CM | POA: Insufficient documentation

## 2015-12-18 DIAGNOSIS — W01198A Fall on same level from slipping, tripping and stumbling with subsequent striking against other object, initial encounter: Secondary | ICD-10-CM | POA: Insufficient documentation

## 2015-12-18 DIAGNOSIS — S199XXA Unspecified injury of neck, initial encounter: Secondary | ICD-10-CM | POA: Insufficient documentation

## 2015-12-18 DIAGNOSIS — Y998 Other external cause status: Secondary | ICD-10-CM | POA: Insufficient documentation

## 2015-12-18 DIAGNOSIS — Y9289 Other specified places as the place of occurrence of the external cause: Secondary | ICD-10-CM | POA: Insufficient documentation

## 2015-12-18 DIAGNOSIS — S0003XA Contusion of scalp, initial encounter: Secondary | ICD-10-CM | POA: Insufficient documentation

## 2015-12-18 NOTE — ED Notes (Addendum)
The patient said she was at the park and fell hit her head on the concrete.  She denies LOC but says she feels "like her head is hollow" and is nauseated.  She rates her pain 8/10.  She has not taken anything for her headache.

## 2015-12-19 ENCOUNTER — Emergency Department (HOSPITAL_COMMUNITY): Payer: Self-pay

## 2015-12-19 ENCOUNTER — Emergency Department (HOSPITAL_COMMUNITY)
Admission: EM | Admit: 2015-12-19 | Discharge: 2015-12-19 | Disposition: A | Discharge status: Home / Self Care | Payer: Self-pay | Attending: Emergency Medicine | Admitting: Emergency Medicine

## 2015-12-19 ENCOUNTER — Encounter (HOSPITAL_COMMUNITY): Payer: Self-pay | Admitting: Radiology

## 2015-12-19 DIAGNOSIS — W19XXXA Unspecified fall, initial encounter: Secondary | ICD-10-CM

## 2015-12-19 MED ORDER — METOCLOPRAMIDE HCL 10 MG PO TABS: 10.0000 mg | ORAL_TABLET | Freq: Three times a day (TID) | ORAL | Status: DC | PRN

## 2015-12-19 MED ORDER — METOCLOPRAMIDE HCL 10 MG PO TABS
10.0000 mg | ORAL_TABLET | Freq: Once | ORAL | Status: AC
  Administered 2015-12-19: 10 mg via ORAL
  Filled 2015-12-19: qty 1

## 2015-12-19 MED ORDER — DIPHENHYDRAMINE HCL 25 MG PO CAPS
25.0000 mg | ORAL_CAPSULE | Freq: Once | ORAL | Status: AC
  Administered 2015-12-19: 25 mg via ORAL
  Filled 2015-12-19: qty 1

## 2015-12-19 MED ORDER — ACETAMINOPHEN 500 MG PO TABS
1000.0000 mg | ORAL_TABLET | Freq: Once | ORAL | Status: AC
  Administered 2015-12-19: 1000 mg via ORAL
  Filled 2015-12-19: qty 2

## 2015-12-19 NOTE — ED Provider Notes (Signed)
CSN: [id number]     Arrival date & time 12/18/15  2054 History  By signing my name below, I, Budd PalmerVanessa Prueter, attest that this documentation has been prepared under the direction and in the presence of Tomasita CrumbleAdeleke Jaykob Minichiello, MD. Electronically Signed: Budd PalmerVanessa Prueter, ED Scribe. 12/19/2015. 3:12 AM.     Chief Complaint  Patient presents with  . Fall    The patient said she was at the park and fell hit her head on the concrete.  She denies LOC but says she feels "like her head is hollow" and is nauseated.  She rates her pain 8/10.   The history is provided by the patient and the spouse. No language interpreter was used.   HPI Comments: Sarah Andrews is a 45 y.o. female who presents to the Emergency Department complaining of a fall that occurred 8 hours ago. Per husband, pt was at the park when she tripped over some pipes and fell, striking the back of her head on the concrete. She denies LOC. He notes pt was able to drive home and began feeling nauseated 6 hours ago. He notes pt's pain to the left posterior skull and nausea have relieved somewhat, but now pt is c/o pain to the left side of the head, as well as to the right side of the neck. He notes pt has not taken anything for her symptoms. He denies pt having vomiting.   History reviewed. No pertinent past medical history. History reviewed. No pertinent past surgical history. History reviewed. No pertinent family history. Social History  Substance Use Topics  . Smoking status: Never Smoker   . Smokeless tobacco: Never Used  . Alcohol Use: Yes     Comment: socially   OB History    No data available     Review of Systems A complete 10 system review of systems was obtained and all systems are negative except as noted in the HPI and PMH.   Allergies  Review of patient's allergies indicates not on file.  Home Medications   Prior to Admission medications   Not on File   BP 106/71 mmHg  Pulse 62  Temp(Src) 98.6 F (37 C) (Oral)  Resp 18   Ht 5' (1.524 m)  Wt 135 lb (61.236 kg)  BMI 26.37 kg/m2  SpO2 100%  LMP 11/13/2015 Physical Exam  Constitutional: She is oriented to person, place, and time. She appears well-developed and well-nourished. No distress.  HENT:  Nose: Nose normal.  Mouth/Throat: Oropharynx is clear and moist. No oropharyngeal exudate.  2 cm soft tissue hematoma to the base of the skull with severe TTP  Eyes: Conjunctivae and EOM are normal. Pupils are equal, round, and reactive to light. No scleral icterus.  Neck: Normal range of motion. Neck supple. No JVD present. No tracheal deviation present. No thyromegaly present.  Cardiovascular: Normal rate, regular rhythm and normal heart sounds.  Exam reveals no gallop and no friction rub.   No murmur heard. Pulmonary/Chest: Effort normal and breath sounds normal. No respiratory distress. She has no wheezes. She exhibits no tenderness.  Abdominal: Soft. Bowel sounds are normal. She exhibits no distension and no mass. There is no tenderness. There is no rebound and no guarding.  Musculoskeletal: Normal range of motion. She exhibits tenderness. She exhibits no edema.  R para-cervical TTP, no midline C-spine TTP  Lymphadenopathy:    She has no cervical adenopathy.  Neurological: She is alert and oriented to person, place, and time. No cranial nerve deficit. She  exhibits normal muscle tone.  Normal strenght and sensation all extremities, normal cereballar testing  Skin: Skin is warm and dry. No rash noted. No erythema. No pallor.  Nursing note and vitals reviewed.   ED Course  Procedures  DIAGNOSTIC STUDIES: Oxygen Saturation is 98% on RA, normal by my interpretation.    COORDINATION OF CARE: 2:59 AM - Discussed probable concussion as well as plans to order diagnostic imaging and medication for pain. Pt advised of plan for treatment and pt agrees.  Labs Review Labs Reviewed - No data to display  Imaging Review Ct Head Wo Contrast  12/19/2015  CLINICAL  DATA:  Larey Seat, striking her head on the concrete. Nausea. Headache. EXAM: CT HEAD WITHOUT CONTRAST CT CERVICAL SPINE WITHOUT CONTRAST TECHNIQUE: Multidetector CT imaging of the head and cervical spine was performed following the standard protocol without intravenous contrast. Multiplanar CT image reconstructions of the cervical spine were also generated. COMPARISON:  None. FINDINGS: CT HEAD FINDINGS There is no intracranial hemorrhage, mass or evidence of acute infarction. There is no extra-axial fluid collection. Gray matter and white matter appear normal. Cerebral volume is normal for age. Brainstem and posterior fossa are unremarkable. The CSF spaces appear normal. The bony structures are intact. The visible portions of the paranasal sinuses are clear. The orbits are intact. CT CERVICAL SPINE FINDINGS The vertebral column, pedicles and facet articulations are intact. There is no evidence of acute fracture. No acute soft tissue abnormalities are evident. No significant arthritic changes are evident. IMPRESSION: 1. Negative for acute intracranial traumatic injury.  Normal brain. 2. Negative for acute cervical spine fracture Electronically Signed   By: Ellery Plunk M.D.   On: 12/19/2015 05:58   Ct Cervical Spine Wo Contrast  12/19/2015  CLINICAL DATA:  Larey Seat, striking her head on the concrete. Nausea. Headache. EXAM: CT HEAD WITHOUT CONTRAST CT CERVICAL SPINE WITHOUT CONTRAST TECHNIQUE: Multidetector CT imaging of the head and cervical spine was performed following the standard protocol without intravenous contrast. Multiplanar CT image reconstructions of the cervical spine were also generated. COMPARISON:  None. FINDINGS: CT HEAD FINDINGS There is no intracranial hemorrhage, mass or evidence of acute infarction. There is no extra-axial fluid collection. Gray matter and white matter appear normal. Cerebral volume is normal for age. Brainstem and posterior fossa are unremarkable. The CSF spaces appear normal.  The bony structures are intact. The visible portions of the paranasal sinuses are clear. The orbits are intact. CT CERVICAL SPINE FINDINGS The vertebral column, pedicles and facet articulations are intact. There is no evidence of acute fracture. No acute soft tissue abnormalities are evident. No significant arthritic changes are evident. IMPRESSION: 1. Negative for acute intracranial traumatic injury.  Normal brain. 2. Negative for acute cervical spine fracture Electronically Signed   By: Ellery Plunk M.D.   On: 12/19/2015 05:58   I have personally reviewed and evaluated these images and lab results as part of my medical decision-making.   EKG Interpretation None      MDM   Final diagnoses:  None    Patient presents to the ED after a fall.  She states it was purely mechanical.  She denies any prodromal symptoms.  She was given tylenol, reglan, and benadryl for her headache.  Concussion education given.  CT scans are negative for injury.  She was advised to fu with PCP within 3 days for close management.  Strict return precautions given.  She appears well and in NAD.  VS remain within her normal limits and  she is safe for DC.   I personally performed the services described in this documentation, which was scribed in my presence. The recorded information has been reviewed and is accurate.     Tomasita Crumble, MD 12/19/15 [phone]

## 2015-12-19 NOTE — ED Notes (Signed)
Pt left with all her belongings and ambulated out of the treatment area.  

## 2015-12-19 NOTE — Discharge Instructions (Signed)
Conmocin cerebral - Adultos (Concussion, Adult)  [name], your CT scan does not show any injury.  You have a concussion and need to follow up with a primary care doctor within 3 days for close follow up.  Take reglan as needed for headache and nausea.  Return to the ER immediately for any worsening symptoms.  Thank you.        [name], su tomografa computarizada no muestra ninguna lesin. Usted tiene una conmocin cerebral y la necesidad de seguimiento con un mdico de atencin primaria dentro de 3 das para el seguimiento cercano. Tome reglan segn sea necesario para el dolor de cabeza y las nuseas. Regrese a la sala de emergencias inmediatamente para cualquier empeoramiento de los sntomas. Gracias.    Una conmocin es una lesin cerebral. Las causas pueden ser:  Un golpe en la cabeza.  Un movimiento rpido y brusco (sacudida) de la cabeza o el cuello. Generalmente no pone en peligro la vida. Sin embargo, puede causar problemas graves. Si sufri una conmocin cerebral antes, puede tener sntomas similares a la conmocin despus de un golpe en la cabeza. CUIDADOS EN EL HOGAR Instrucciones generales  Siga cuidadosamente las indicaciones del mdico.  Tome los medicamentos como le indic el mdico.  Solo tome los medicamentos que su mdico considere seguros.  No beba alcohol hasta que el mdico lo autorice. El alcohol y algunos medicamentos pueden hacer ms lenta la curacin. Tambin pueden ponerlo en riesgo de sufrir otras lesiones.  Si tiene dificultad para recordar las cosas, escrbalas.  Trate de hacer una cosa por vez si se distrae con facilidad. Por ejemplo, no mire televisin mientras prepara la cena.  Consulte con familiares y amigos si debe tomar decisiones importantes.  Concurra a las consultas de control con el mdico, segn las indicaciones.  Observe sus sntomas. Dgale a los dems que hagan lo mismo. En algunos casos, despus de una conmocin cerebral  surgen problemas graves. Es ms probable que [employer] graves los sufran los adultos [place].  Informe a los [address], el departamento de enfermera de la escuela, el consejero escolar, Public affairs consultant acerca de su conmocin cerebral. Hgales saber lo que puede y lo que no Scientist, product/process development. Ellos debern observarlo para ver si:  Tiene ms dificultad para prestar atencin o concentrarse.  Le cuesta an ms recordar las cosas o aprender cosas nuevas.  Necesita ms tiempo que lo habitual para finalizar las tareas.  Est ms molesto (irritable) que antes.  Tampoco puede Charity fundraiser.  Tiene ms problemas que antes.  Haga reposo. Asegrese de que:  Duerme bien por la noche.  Se va a dormir temprano.  Acustese [employer] a la misma hora aproximadamente. Trate de despertarse a la misma hora.  Descanse Administrator.  Tome una siesta cuando se sienta cansado.  Limite las actividades que requieran mucha concentracin. Estas pueden ser:  Hacer la tarea para Advice worker.  Hacer tareas relacionadas con un trabajo.  Mirar televisin.  Usar la computadora. Regreso a las [employer] a las actividades habituales gradualmente, no [place] hacer todo de Building control surveyor. Debe darles al cuerpo y el cerebro su tiempo para recuperarse.   No practique deportes ni realice otras actividades deportivas hasta que el mdico lo autorice.  Consulte a su mdico cundo puede andar en bicicleta o conducir otros vehculos o mquinas. Nunca haga estas cosas si se siente mareado.  Pregunte a su mdico cundo podr volver a la escuela o al  trabajo. Prevencin de otra conmocin cerebral Es muy importante que evite otra lesin cerebral, especialmente antes de que se haya recuperado. En casos raros, una nueva lesin puede causar daos cerebrales permanentes, hinchazn del cerebro o la muerte. El riesgo es mayor durante los primeros 7 a 10das despus de una lesin. Evite las  lesiones:   Use el cinturn de seguridad al conducir un automvil.  Evite beber alcohol en exceso.  Evite las actividades que puedan favorecer una segunda conmocin cerebral (como al practicar deportes de contacto).  Use un casco cuando practique actividades como:  Andar en bicicleta.  Esquiar.  Andar en patineta.  Andar en patines.  Haga de su hogar un lugar ms seguro:  Retire las cosas del piso o de las escaleras que podran hacerlo tropezar.  Coloque barras en los baos y pasamanos en las escaleras.  Ponga alfombras antideslizantes en pisos y baeras.  Mejore la iluminacin en zonas de penumbra. SOLICITE AYUDA SI:  Tiene ms dificultad para prestar atencin o concentrarse.  Le cuesta an ms recordar las cosas o aprender cosas nuevas.  Necesita ms tiempo que lo habitual para finalizar las tareas.  Est ms molesto (irritable) que antes.  Tampoco puede Charity fundraisercontrolar el estrs.  Tiene ms problemas que antes.  Tiene problemas para mantener el equilibrio.  No logra reaccionar rpidamente cuando lo necesita. Solicite ayuda si tiene alguno de estos problemas durante ms de 2 semanas:   Dolor de cabeza que perdura (crnico).  Mareos o problemas de equilibrio.  Ganas de vomitar (nuseas).  Problemas para ver (de vista).  Sentirse afectado por ruidos o la luz ms que lo normal.  Sentir tristeza, decaimiento, sufrimiento espiritual, melancola, pesimismo o vaco (depresin).  Cambios de humor (cambios en el estado de nimo).  Sensacin de miedo o nerviosismo por lo que podra ocurrir (ansiedad).  Se siente abrumado.  Problemas de memoria.  Dificultad para concentrarse o Engineer, technical salesmantener la atencin.  Problemas para dormir.  Cansancio permanente. SOLICITE AYUDA DE INMEDIATO SI:   Tiene dolores de cabeza intensos o estos empeoran.  Siente debilidad (aun si es solo en [place], una pierna o parte del rostro).  Tiene falta de sensibilidad  (adormecimiento).  Siente que pierde el equilibrio.  No deja de vomitar.  Siente cansancio.  Uno de los centros negros del ojo (pupila) es ms grande que el otro.  Se retuerce o se sacude violentamente (tiene convulsiones).  El habla no es clara (arrastra las palabras).  Se siente ms confundido, se enoja con ms facilidad (agitacin) o est ms irritable que antes.  Tiene ms dificultad para descansar que antes.  No puede Nutritional therapistreconocer personas o lugares.  Siente dolor en el cuello.  Le resulta difcil despertarse.  Tiene cambios de conducta inusuales.  Se desmaya (pierde el conocimiento). ASEGRESE DE QUE:   Comprende estas instrucciones.  Controlar su afeccin.  Recibir ayuda de inmediato si no mejora o si empeora.   Esta informacin no tiene Theme park managercomo fin reemplazar el consejo del mdico. Asegrese de hacerle al mdico cualquier pregunta que tenga.   Document Released: 09/26/2010 Document Revised: 09/14/2014 Elsevier Interactive Patient Education [employer]2016 Elsevier Inc.

## 2016-01-03 ENCOUNTER — Encounter (HOSPITAL_COMMUNITY): Payer: Self-pay | Admitting: *Deleted

## 2016-01-07 ENCOUNTER — Other Ambulatory Visit: Payer: Self-pay | Admitting: Obstetrics and Gynecology

## 2016-01-07 DIAGNOSIS — Z1231 Encounter for screening mammogram for malignant neoplasm of breast: Secondary | ICD-10-CM

## 2016-01-30 ENCOUNTER — Ambulatory Visit
Admission: RE | Admit: 2016-01-30 | Discharge: 2016-01-30 | Disposition: A | Discharge status: Home / Self Care | Payer: No Typology Code available for payment source | Source: Ambulatory Visit | Attending: Obstetrics and Gynecology | Admitting: Obstetrics and Gynecology

## 2016-01-30 ENCOUNTER — Ambulatory Visit (HOSPITAL_COMMUNITY)
Admission: RE | Admit: 2016-01-30 | Discharge: 2016-01-30 | Disposition: A | Discharge status: Home / Self Care | Payer: Self-pay | Source: Ambulatory Visit | Attending: Obstetrics and Gynecology | Admitting: Obstetrics and Gynecology

## 2016-01-30 ENCOUNTER — Encounter (HOSPITAL_COMMUNITY): Payer: Self-pay

## 2016-01-30 VITALS — BP 102/64 | Temp 98.2°F | Ht 63.0 in | Wt 138.3 lb

## 2016-01-30 DIAGNOSIS — Z1239 Encounter for other screening for malignant neoplasm of breast: Secondary | ICD-10-CM

## 2016-01-30 DIAGNOSIS — Z1231 Encounter for screening mammogram for malignant neoplasm of breast: Secondary | ICD-10-CM

## 2016-01-30 DIAGNOSIS — R87612 Low grade squamous intraepithelial lesion on cytologic smear of cervix (LGSIL): Secondary | ICD-10-CM

## 2016-01-30 HISTORY — DX: Migraine, unspecified, not intractable, without status migrainosus: G43.909

## 2016-01-30 NOTE — Patient Instructions (Signed)
Educational materials on self breast awareness given. Explained to Sarah Andrews the colposcopy the needed follow up for her abnormal Pap smear 12/25/2015. [facility]. Appointment scheduled for Monday, February 10, 2016 at 1410. [facility]. Appointment scheduled for Thursday, Jan 30, 2016 at 1030. Patient aware of appointments and will be there. Let patient know the Breast Center will follow up with her within the next couple weeks with results of mammogram by letter or phone. Sarah Andrews verbalized understanding.  Tyden Kann, Kathaleen Maserhristine Poll, RN 11:24 AM

## 2016-01-30 NOTE — Progress Notes (Addendum)
[facility] due to having an abnormal Pap smear 12/25/2015 that a colposcopy is recommended for follow up.  Pap Smear:  Pap smear not completed today. [facility] with positive HPV. [facility] for a colposcopy. Appointment scheduled for Monday, February 10, 2016 at 1410. Per patient has no history of abnormal Pap smears prior to the most recent Pap smear. Pap smear result is in EPIC.  Physical exam: Breasts Breasts symmetrical. No skin abnormalities bilateral breasts. No nipple retraction bilateral breasts. No nipple discharge bilateral breasts. No lymphadenopathy. No lumps palpated bilateral breasts. No complaints of pain or tenderness on exam. [facility] for a screening mammogram. Appointment scheduled for Thursday, Jan 30, 2016 at 1030.  Pelvic/Bimanual No Pap smear completed today since last Pap smear was 12/25/2015. Pap smear not indicated per BCCCP guidelines.   Smoking History: Patient has never smoked.  Patient Navigation: Patient education provided. Access to services provided for patient through [facility] program. Spanish interpreter provided.   Used Spanish interpreter [employer]iel Gallego from [employer].

## 2016-02-07 ENCOUNTER — Encounter (HOSPITAL_COMMUNITY): Payer: Self-pay | Admitting: *Deleted

## 2016-02-10 ENCOUNTER — Other Ambulatory Visit (HOSPITAL_COMMUNITY)
Admission: RE | Admit: 2016-02-10 | Discharge: 2016-02-10 | Disposition: A | Discharge status: Home / Self Care | Payer: Self-pay | Source: Ambulatory Visit | Attending: Obstetrics and Gynecology | Admitting: Obstetrics and Gynecology

## 2016-02-10 ENCOUNTER — Encounter: Payer: Self-pay | Admitting: General Practice

## 2016-02-10 ENCOUNTER — Ambulatory Visit (INDEPENDENT_AMBULATORY_CARE_PROVIDER_SITE_OTHER): Payer: Self-pay | Admitting: Obstetrics and Gynecology

## 2016-02-10 DIAGNOSIS — R87612 Low grade squamous intraepithelial lesion on cytologic smear of cervix (LGSIL): Secondary | ICD-10-CM | POA: Insufficient documentation

## 2016-02-10 LAB — POCT PREGNANCY, URINE: PREG TEST UR: NEGATIVE

## 2016-02-10 NOTE — Progress Notes (Signed)
GYNECOLOGY CLINIC COLPOSCOPY VISIT AND PROCEDURE NOTE  45 y.o. No obstetric history on file. here for colposcopy for lsil pap smear on 12/2015. Prior cervical cytology and/or colposcopy findings: normal. The patient reports the following prior treatments to the vulva/vagina/cervix: none. The patient is not a cigarette smoker. The patient is not immunosuppressed. The patient is not pregnant. The patient is not taking anticoagulants and denies allergy to iodine.  Patient given informed consent, signed copy in the chart, time out was performed. Urine pregnancy test performed and confirmed to be negative.  Placed in lithotomy position.   Gross findings:   Vagina: normal  Vulva: normal  Cervix: normal  Visualization after:  Acetic acid: acetowhite margin just at external os, at 12 o'clock  Green or blue filter: no abnormal vessels  Upper one-third of vagina examined, revealing: normal mucosa  Biopsies obtained: 12 o'clock  ECC specimen obtained: yes  Colposcopy adequate? No (no scj seen after cervical dilation)  All specimens were labelled and sent to pathology.  Patient was given post procedure instructions.  Will follow up pathology and manage accordingly.      Sarah ChockNoah Quaid Yeakle, MD OB/GYN Fellow

## 2016-02-12 ENCOUNTER — Telehealth: Payer: Self-pay | Admitting: *Deleted

## 2016-02-12 NOTE — Telephone Encounter (Signed)
-----   Message from Kathrynn RunningNoah Bedford Wouk, MD sent at 02/11/2016  8:26 PM EDT ----- colpo shows no cin. Please call patient to share good news. Needs pap with hpv co-test in one year. Thanks, Anette RiedelNoah  ----- Message -----    From: [facility] [place] [facility]    Sent: 02/10/2016   2:34 PM      To: Kathrynn RunningNoah Bedford Wouk, MD

## 2016-02-12 NOTE — Telephone Encounter (Signed)
Called patient using Spanish interpreter [phone]. Informed her of colpo results and need to f/u in one year for PAP w/ hpv cotesting. Patient voiced understanding.

## 2016-02-20 ENCOUNTER — Ambulatory Visit (HOSPITAL_BASED_OUTPATIENT_CLINIC_OR_DEPARTMENT_OTHER): Payer: Self-pay

## 2016-02-20 ENCOUNTER — Other Ambulatory Visit: Payer: Self-pay

## 2016-02-20 VITALS — BP 100/62 | HR 72 | Temp 98.3°F | Resp 14 | Ht 61.0 in | Wt 139.4 lb

## 2016-02-20 DIAGNOSIS — Z0000 Encounter for general adult medical examination without abnormal findings: Secondary | ICD-10-CM

## 2016-02-20 LAB — LIPID PANEL
CHOLESTEROL TOTAL: 197 mg/dL (ref 100–199)
Chol/HDL Ratio: 3.9 ratio units (ref 0.0–4.4)
HDL: 50 mg/dL (ref >39)
LDL CALC: 120 mg/dL — AB (ref 0–99)
Triglycerides: 137 mg/dL (ref 0–149)
VLDL CHOLESTEROL CAL: 27 mg/dL (ref 5–40)

## 2016-02-20 NOTE — Progress Notes (Signed)
Patient is a [facility] with interpreter Delorise RoyalsJulie Sowell present and is currently a [facility]CP patient effective 01/30/2016.   Clinical Measurements: Patient is 5 ft. 1 inches, weight 139.4 lbs, and BMI 26.4.   Medical History: Patient has no history of high cholesterol. Patient does not have a history of hypertension or diabetes. Per patient no diagnosed history of coronary heart disease, heart attack, heart failure, stroke/TIA, vascular disease or congenital heart defects.   Blood Pressure, Self-measurement: Patient states has no reason to check Blood pressure.  Nutrition Assessment: Patient stated that eats 1 fruit every day. Patient states she eats 1 serving of vegetables a day. Per patient does eat 3 or more ounces of whole grains daily. Patient doesn't eat two or more servings of fish weekly. Patient states that eats fish once a week. Patient states she does not drink more than 36 ounces or 450 calories of beverages with added sugars weekly. Patient stated she does watch her salt intake.   Physical Activity Assessment: Patient stated she walks and does Zumba 5 to 6 days a week. Patient stated has probably been doing 300 minutes of moderate exercise and 90 minutes vigorous exercise a week .  Smoking Status: Patient stated that quit smoking three years ago and is not exposed to smoke.  Quality of Life Assessment: In assessing patient's physical quality of life she stated that out of the past 30 days that she has felt her health was good all but 15 days. Patient also stated that in the past 30 days that her mental health was not good including stress, depression and problems with emotions for 2 days. Per patient stated her mother had died the 9720 th of last month. Patient did state that out of the past 30 days she felt her physical or mental health had not kept her from doing her usual activities including self-care, work or recreation for all days.   Plan: Lab work  will be done today including a lipid panel and Hgb A1C. Will call lab results when they are finished. Patient will receive second Health Coaching when results are called and then as patient wants.

## 2016-02-20 NOTE — Progress Notes (Signed)
FIRST HEALTH COACH: During initial screening with Delorise RoyalsJulie Sowell interpreter present, wellness health coaching was started.  Nutrition: Discussed with patient that normally should eat 2 servings of fruits and  2 1/2 vegetables daily. Patient is not eating fish often and not ones with omega 3's. Discussed the types of fish that should be eating. Discussed with patient the reason that should be eating at least 5 ounces of grains a day with 3 ounces being whole grains. Informed patient about sugar and salt which patient is doing.  Physical Activity: Patient stated that she is walking 60 minutes every day and 90 minutes of vigorous, Informed patient that 150 minutes of regular or 75 minutes of vigorous activity is the minimal activity recommended.  PLAN: Will set goal when call lab results and do second health coaching. Will think about what goals may want to set.

## 2016-02-20 NOTE — Patient Instructions (Signed)
Discussed health assessment with patient.She will be called with results of lab work and we will then discussed any further follow up the patient needs. Patient verbalized understanding. 

## 2016-02-21 LAB — HEMOGLOBIN A1C
ESTIMATED AVERAGE GLUCOSE: 114 mg/dL
HEMOGLOBIN A1C: 5.6 % (ref 4.8–5.6)

## 2016-02-24 ENCOUNTER — Telehealth: Payer: Self-pay

## 2016-02-24 NOTE — Telephone Encounter (Addendum)
LAB RESULTS  Called by Delorise RoyalsJulie Sowell interpreter to inform about lab work from 02/20/16. Interpreter Delorise RoyalsJulie Sowell informed patient: BMI 26.4, cholesterol- 197, HDL- 50, LDL- 120, triglycerides - 454137, and HBG-A1C - 5.6.   SECOMD HEALTH COACHING:Did risk reduction Health Coaching concerning BMI. Informed that normal BMI was 18 to 25 and patient is in slightly overweight range. Discussed the need to loose weigh and briefly discussed  increasing fiber and exercise to help lower LDL's. Discussed portion sizes and avoiding fatty food. Patient asked for one on one health coaching session.  PLAN: Scheduled one on one Health Coaching for June 22th at 2 PM. Will develop materials for patient  TIME SPENT WITH PATIENT: 15 minutes..Marland Kitchen

## 2016-02-26 ENCOUNTER — Telehealth: Payer: Self-pay

## 2016-02-26 NOTE — Telephone Encounter (Signed)
Called per Julie Sowell interpreter to remind of WISEWOMAN appointment for health Coaching. Address given for [facility] at [facility].   

## 2016-02-27 ENCOUNTER — Ambulatory Visit: Payer: No Typology Code available for payment source

## 2016-03-04 NOTE — Addendum Note (Signed)
Encounter addended by: Priscille Heidelberghristine P Lashawne Dura, RN on: 03/04/2016 10:29 AM<BR>     Documentation filed: Notes Section

## 2016-03-06 ENCOUNTER — Ambulatory Visit: Payer: No Typology Code available for payment source

## 2016-03-06 ENCOUNTER — Telehealth: Payer: Self-pay

## 2016-03-06 NOTE — Telephone Encounter (Signed)
Called to follow up on why did not come for health coaching. Left message. Will call again.

## 2016-03-25 ENCOUNTER — Telehealth: Payer: Self-pay

## 2016-03-25 NOTE — Telephone Encounter (Signed)
Interpreter Albertina SenegalMarly Adams [facility] had for patient and it was the same as one we had. Dulce stated that when she talked with patient that she would give her the number to call WISEWOMAN Program back.

## 2016-03-25 NOTE — Telephone Encounter (Signed)
[facility] health [facility] and a man answered the phone with interpreter Albertina SenegalMarly Adams and told her that no onr lived there by that number. So [facility]

## 2016-03-26 ENCOUNTER — Telehealth: Payer: Self-pay

## 2016-03-26 NOTE — Telephone Encounter (Signed)
Patient returned call to interpreter Sarah Andrews. Patient stated that really had not understood lab results and would like to reschedule a Health Coaching appointment. Appointment scheduled for Tuesday, July 25 at 3 PM.

## 2016-04-15 ENCOUNTER — Telehealth: Payer: Self-pay

## 2016-04-15 NOTE — Telephone Encounter (Signed)
THIRD HEALTH COACHING Patient called per phone today for Health Coaching with interpreter Albertina SenegalMarly Adams present. Patient's areas of concern were weight, fiber and exercise.    HEALTH COACHING:  Patient stated had decreased the amount of grease she is eating. Per patient is watching her portion sizes when eats. Patient stated that she is exercising on most days. Explained the importance again of increasing fiber in her diet with examples like: 1/2 white and 1/2 brown rice with changing over to brown rice, whole wheat bread, broccoli and corn tortillas.   PLAN:  Will be called in future for follow up assessment.

## 2016-08-20 ENCOUNTER — Encounter: Payer: No Typology Code available for payment source | Admitting: Obstetrics & Gynecology

## 2016-10-19 ENCOUNTER — Telehealth: Payer: Self-pay

## 2016-10-19 NOTE — Telephone Encounter (Signed)
Wisewoman - Follow up Screening  Assessment: F/U screening.   Patient's goal was to lose weight.  Initial Screening date: 02/20/2016  Medication Status: Patient is not currently taking any medications  Blood Pressure Measurement: Patient is not taking her BP at home currently  Nutrition: Patient eats 2 portions of fruit and vegetables each day, does not eat fish regularly, eats more than 3 ounces of whole grains daily, drinks mostly water and has been watching her sodium intake.   Physical Activity: Patient participates in zumba for 1 hour each day  Smoking Status: Patient is not a current smoker  Quality of Life: Patient denies any emotional issues at this time  Navigation: Access through BCCCP/Wisewoman program provided.

## 2017-02-17 ENCOUNTER — Other Ambulatory Visit: Payer: Self-pay | Admitting: Obstetrics and Gynecology

## 2017-02-17 DIAGNOSIS — Z1231 Encounter for screening mammogram for malignant neoplasm of breast: Secondary | ICD-10-CM

## 2017-02-25 ENCOUNTER — Encounter (HOSPITAL_COMMUNITY): Payer: Self-pay

## 2017-02-25 ENCOUNTER — Ambulatory Visit
Admission: RE | Admit: 2017-02-25 | Discharge: 2017-02-25 | Disposition: A | Discharge status: Home / Self Care | Payer: No Typology Code available for payment source | Source: Ambulatory Visit | Attending: Obstetrics and Gynecology | Admitting: Obstetrics and Gynecology

## 2017-02-25 ENCOUNTER — Ambulatory Visit (HOSPITAL_COMMUNITY)
Admission: RE | Admit: 2017-02-25 | Discharge: 2017-02-25 | Disposition: A | Discharge status: Home / Self Care | Payer: Self-pay | Source: Ambulatory Visit | Attending: Obstetrics and Gynecology | Admitting: Obstetrics and Gynecology

## 2017-02-25 VITALS — BP 100/62 | Ht 59.06 in | Wt 126.4 lb

## 2017-02-25 DIAGNOSIS — Z1231 Encounter for screening mammogram for malignant neoplasm of breast: Secondary | ICD-10-CM

## 2017-02-25 DIAGNOSIS — Z01419 Encounter for gynecological examination (general) (routine) without abnormal findings: Secondary | ICD-10-CM

## 2017-02-25 NOTE — Addendum Note (Signed)
Encounter addended by: Priscille HeidelbergBrannock, Kalsey Lull P, RN on: 02/25/2017 11:37 AM<BR>    Actions taken: Sign clinical note

## 2017-02-25 NOTE — Progress Notes (Addendum)
No complaints today.   Pap Smear: Pap smear completed today. [facility] and LGSIL with positive HPV. Patient had a colposcopy to follow up for abnormal Pap smear 02/10/2016 that was benign. Per patient last Pap smear is the only abnormal Pap smear she has had. Pap smear result is in EPIC.  Physical exam: Breasts Breasts symmetrical. No skin abnormalities bilateral breasts. No nipple retraction bilateral breasts. No nipple discharge bilateral breasts. No lymphadenopathy. No lumps palpated bilateral breasts. No complaints of pain or tenderness on exam. [facility]. Appointment scheduled for Thursday, February 25, 2017 at 1040.  Pelvic/Bimanual   Ext Genitalia No lesions, no swelling and no discharge observed on external genitalia.         Vagina Vagina pink and normal texture. No lesions or discharge observed in vagina. Cystocele observed.         Cervix Cervix is present. Cervix pink and of normal texture. No discharge observed.     Uterus Uterus is present and palpable. Uterus in normal position and normal size.        Adnexae Bilateral ovaries present and palpable. No tenderness on palpation.          Rectovaginal No rectal exam completed today since patient had no rectal complaints. No skin abnormalities observed on exam.    Smoking History: Patient is a former smoker that quit 04/07/2013.  Patient Navigation: Patient education provided. Access to services provided for [facility]. Spanish interpreter provided.   Used Spanish interpreter [employer]anca Lindner from [facility].

## 2017-02-25 NOTE — Patient Instructions (Signed)
Explained breast self awareness with Sarah Andrews. Let patient know that if today's Pap smear is normal that her next Pap smear will be due in one year due to her recent history of an abnormal Pap smear. Referred [facility] to the [facility] for a screening mamm[facility]. Appointment scheduled for Thursday, February 25, 2017 at 1040. Let patient know will follow up with her within the next couple weeks with results of Pap smear by phone. Informed patient that the Breast Center will follow up with her within the next couple weeks with results of mammogram by letter or phone. Sarah Andrews verbalized understanding.  Davonna Ertl, Kathaleen Maserhristine Poll, RN 10:57 AM

## 2017-02-25 NOTE — Addendum Note (Signed)
Encounter addended by: Priscille HeidelbergBrannock, Hadlei Stitt P, RN on: 02/25/2017 11:43 AM<BR>    Actions taken: Sign clinical note

## 2017-02-26 ENCOUNTER — Encounter (HOSPITAL_COMMUNITY): Payer: Self-pay | Admitting: *Deleted

## 2017-03-02 LAB — CYTOLOGY - PAP
Diagnosis: NEGATIVE
HPV: NOT DETECTED

## 2017-03-03 ENCOUNTER — Telehealth (HOSPITAL_COMMUNITY): Payer: Self-pay | Admitting: *Deleted

## 2017-03-03 NOTE — Telephone Encounter (Signed)
Telephoned patient at home number and advised patient of negative pap smear results. HPV was negative. Next pap smear due in one year due to history of abnormal. Patient voiced understanding. Used interpreter Delorise RoyalsJulie Sowell.

## 2017-04-05 ENCOUNTER — Ambulatory Visit (INDEPENDENT_AMBULATORY_CARE_PROVIDER_SITE_OTHER): Payer: Self-pay

## 2017-04-05 ENCOUNTER — Encounter: Payer: Self-pay | Admitting: Physician Assistant

## 2017-04-05 ENCOUNTER — Ambulatory Visit (INDEPENDENT_AMBULATORY_CARE_PROVIDER_SITE_OTHER): Payer: Self-pay | Admitting: Physician Assistant

## 2017-04-05 VITALS — BP 109/68 | HR 68 | Temp 97.7°F | Resp 18 | Ht 60.24 in | Wt 128.0 lb

## 2017-04-05 DIAGNOSIS — R531 Weakness: Secondary | ICD-10-CM

## 2017-04-05 DIAGNOSIS — M546 Pain in thoracic spine: Secondary | ICD-10-CM

## 2017-04-05 DIAGNOSIS — G8929 Other chronic pain: Secondary | ICD-10-CM

## 2017-04-05 DIAGNOSIS — M5412 Radiculopathy, cervical region: Secondary | ICD-10-CM

## 2017-04-05 DIAGNOSIS — R200 Anesthesia of skin: Secondary | ICD-10-CM

## 2017-04-05 DIAGNOSIS — M62838 Other muscle spasm: Secondary | ICD-10-CM

## 2017-04-05 MED ORDER — CYCLOBENZAPRINE HCL 10 MG PO TABS: 10.0000 mg | ORAL_TABLET | Freq: Three times a day (TID) | ORAL | 0 refills | Status: DC | PRN

## 2017-04-05 MED ORDER — MELOXICAM 15 MG PO TABS
15.0000 mg | ORAL_TABLET | Freq: Every day | ORAL | 1 refills | Status: DC
Start: 2017-04-05 — End: 2017-05-26

## 2017-04-05 MED ORDER — PREDNISONE 20 MG PO TABS: ORAL_TABLET | ORAL | 0 refills | Status: DC

## 2017-04-05 NOTE — Patient Instructions (Addendum)
Flexeril es un relajante muscular. Esto ayudar con tu tensin muscular. esto puede causar somnolencia.  Meloxicam es un antiinflamatorio. No lo use con ningn otro medicamento para el dolor que no sea Tylenol / acetaminofn, por lo que no contiene aleve, ibuprofeno, motrin, advil, etc. La prednisona es para el entumecimiento y el hormigueo de sus extremidades.  Poner hielo en una bolsa de [place] una toalla entre su piel y la bolsa o entre su frula de yeso y la bolsa. Deje el hielo encendido durante 20 minutos, 2-3 veces al [employer]. Puede usar calor si se siente bien.  Vuelve si no eres mejor en 3-4 semanas.  -----  Put ice in a plastic bag. Place a towel between your skin and the bag or between your plaster splint and the bag. Leave the ice on for 20 minutes, 2-3 times a day. You may use heat if this feels good.    Flexeril is a muscle relaxer. This will help with your muscle tightness.  Meloxicam is an antiinflammatory. Do not use with any other otc pain medication other than tylenol/acetaminophen - so no aleve, ibuprofen, motrin, advil, etc. Prednisone is for the numbness and tingling of your extremities.   Thank you for coming in today. I hope you feel we met your needs.  Feel free to call [facility] if you have any questions or further requests.  Please consider signing up for MyChart if you do not already have it, as this is a great way to communicate with me.  Best,  Whitney McVey, PA-C  IF you received an x-ray today, you will receive an invoice from [facility]. Please contact [facility] at [phone] with questions or concerns regarding your invoice.   IF you received labwork today, you will receive an invoice from [place]. Please contact [facility] at [phone] with questions or concerns regarding your invoice.   Our billing staff will not be able to assist you with questions regarding bills from these companies.  You will be contacted with the lab  results as soon as they are available. The fastest way to get your results is to activate your My Chart account. Instructions are located on the last page of this paperwork. If you have not heard from [place] regarding the results in 2 weeks, please contact this office.

## 2017-04-05 NOTE — Progress Notes (Signed)
Sarah JamesBertha Xantzin-Gonzalez  MRN: [id number] DOB: April 01, 1971  PCP: Catalina Antiguaonstant, Peggy, MD  Subjective:  Pt is a 46 year old female who presents to clinic for upper back pain x 2 months. Speaks spanish, mobile interpreter used today. She works Education officer, environmentalcleaning houses 5 days a week.  Back pain is located in her upper mid-back.   Endorses worsening right arm numbness and tingling. Pain radiates to right arm. Sitting and laying hurts worse. She endorses muscle weakness of right arm. Pain and weakness is worse while she is working. Denies neck pain.  She is taking ibuprofen every day - this helps at the time, then returns. She saw a chiropractor and she has tried yoga and stretching. She is worried she injured her back. She had her 44 year old son massage her back a few weeks ago and felt a pop on her back between her shoulder blades - her symptoms have been worse since that time.     *Review of Systems  Constitutional: Negative for chills, diaphoresis, fatigue and fever.  Musculoskeletal: Positive for back pain. Negative for arthralgias, gait problem, neck pain and neck stiffness.  Skin: Negative.   Neurological: Positive for weakness and numbness. Negative for light-headedness and headaches.    Patient Active Problem List   Diagnosis Date Noted  . Low grade squamous intraepithelial lesion (LGSIL) on cervical Pap smear 02/10/2016  . Dysmenorrhea 04/08/2011  . Dysfunctional uterine bleeding 04/08/2011    Current Outpatient Prescriptions on File Prior to Visit  Medication Sig Dispense Refill  . ibuprofen (ADVIL,MOTRIN) 200 MG tablet Take 200 mg by mouth every 6 (six) hours as needed.    Marland Kitchen. acidophilus (RISAQUAD) CAPS capsule Take 1 capsule by mouth daily. Reported on 01/30/2016    . Maca Root 500 MG CAPS Take 1 capsule by mouth 2 (two) times daily. Reported on 01/30/2016    . metoCLOPramide (REGLAN) 10 MG tablet Take 1 tablet (10 mg total) by mouth every 8 (eight) hours as needed (nausea and headache).  (Patient not taking: Reported on 02/25/2017) 10 tablet 0  . naproxen (NAPROSYN) 500 MG tablet Take 1 tablet (500 mg total) by mouth 2 (two) times daily with a meal. As needed for pain (Patient not taking: Reported on 01/30/2016) 60 tablet 2  . norethindrone (MICRONOR,CAMILA,ERRIN) 0.35 MG tablet Take 1 tablet (0.35 mg total) by mouth daily. (Patient not taking: Reported on 01/30/2016) 1 Package 11   No current facility-administered medications on file prior to visit.     No Known Allergies   Objective:  BP 109/68 (BP Location: Right Arm, Patient Position: Sitting, Cuff Size: Normal)   Pulse 68   Temp 97.7 F (36.5 C) (Oral)   Resp 18   Ht 5' 0.24" (1.53 m)   Wt 128 lb (58.1 kg)   LMP 03/29/2017 (Approximate)   SpO2 100%   BMI 24.80 kg/m   Physical Exam  Constitutional: She is oriented to person, place, and time and well-developed, well-nourished, and in no distress. No distress.  Cardiovascular: Normal rate, regular rhythm and normal heart sounds.   Musculoskeletal:       Cervical back: She exhibits normal range of motion, no tenderness and no bony tenderness.       Thoracic back: She exhibits tenderness and spasm. She exhibits normal range of motion and no bony tenderness.  Neurological: She is alert and oriented to person, place, and time. She has normal strength and normal reflexes. She displays no weakness. GCS score is 15.  Skin: Skin  is warm and dry.  Psychiatric: Mood, memory, affect and judgment normal.  Vitals reviewed.   Dg Thoracic Spine 2 View  Result Date: 04/05/2017 CLINICAL DATA:  Dorsalgia EXAM: THORACIC SPINE 2 VIEWS COMPARISON:  None. FINDINGS: Frontal and lateral views were obtained. No fracture or spondylolisthesis. There is disc space narrowing at T6-7. Other disc spaces appear normal. No erosive change. No paraspinous lesion peer IMPRESSION: Mild osteoarthritic change at T6-7. Other disc spaces appear unremarkable. No fracture or spondylolisthesis.  Electronically Signed   By: Bretta BangWilliam  Woodruff III M.D.   On: 04/05/2017 09:09    Assessment and Plan :  1. Cervical radiculopathy 2. Chronic bilateral thoracic back pain 3. Muscle spasm - meloxicam (MOBIC) 15 MG tablet; Take 1 tablet (15 mg total) by mouth daily.  Dispense: 30 tablet; Refill: 1 - cyclobenzaprine (FLEXERIL) 10 MG tablet; Take 1 tablet (10 mg total) by mouth 3 (three) times daily as needed for muscle spasms.  Dispense: 45 tablet; Refill: 0 - DG Thoracic Spine 2 View; Future - Suspect possible cervical radiculopathy, thoracic OA and muscle spasms contributing to pt's back symptoms. No red flag signs discovered on physical exam. Will treat supportively. RTC in 3-4 weeks if no improvement. Consider neck imaging and possible referral to physical therapy. She understands and agrees with plan.  4. Weakness 5. Numbness - predniSONE (DELTASONE) 20 MG tablet; Take 3 PO QAM x3days, 2 PO QAM x3days, 1 PO QAM x3days  Dispense: 18 tablet; Refill: 0  Marco CollieWhitney Kaydense Rizo, PA-C  [facility] 04/05/2017 8:42 AM

## 2017-04-09 ENCOUNTER — Emergency Department (HOSPITAL_COMMUNITY)
Admission: EM | Admit: 2017-04-09 | Discharge: 2017-04-10 | Disposition: A | Discharge status: Home / Self Care | Payer: No Typology Code available for payment source | Attending: Emergency Medicine | Admitting: Emergency Medicine

## 2017-04-09 ENCOUNTER — Encounter (HOSPITAL_COMMUNITY): Payer: Self-pay | Admitting: Emergency Medicine

## 2017-04-09 DIAGNOSIS — Y658 Other specified misadventures during surgical and medical care: Secondary | ICD-10-CM | POA: Insufficient documentation

## 2017-04-09 DIAGNOSIS — Z87891 Personal history of nicotine dependence: Secondary | ICD-10-CM | POA: Insufficient documentation

## 2017-04-09 DIAGNOSIS — T50905A Adverse effect of unspecified drugs, medicaments and biological substances, initial encounter: Secondary | ICD-10-CM

## 2017-04-09 DIAGNOSIS — T887XXA Unspecified adverse effect of drug or medicament, initial encounter: Secondary | ICD-10-CM | POA: Insufficient documentation

## 2017-04-09 HISTORY — DX: Unspecified osteoarthritis, unspecified site: M19.90

## 2017-04-09 LAB — I-STAT CHEM 8, ED
BUN: 19 mg/dL (ref 6–20)
CHLORIDE: 102 mmol/L (ref 101–111)
Calcium, Ion: 1.14 mmol/L — ABNORMAL LOW (ref 1.15–1.40)
Creatinine, Ser: 0.5 mg/dL (ref 0.44–1.00)
GLUCOSE: 142 mg/dL — AB (ref 65–99)
HEMATOCRIT: 38 % (ref 36.0–46.0)
HEMOGLOBIN: 12.9 g/dL (ref 12.0–15.0)
POTASSIUM: 3.9 mmol/L (ref 3.5–5.1)
Sodium: 139 mmol/L (ref 135–145)
TCO2: 25 mmol/L (ref 0–100)

## 2017-04-09 LAB — I-STAT TROPONIN, ED: Troponin i, poc: 0 ng/mL (ref 0.00–0.08)

## 2017-04-09 LAB — I-STAT BETA HCG BLOOD, ED (MC, WL, AP ONLY)

## 2017-04-09 NOTE — ED Notes (Signed)
Pt stated she went to the doctor last Monday the 30th and dx with arthritis in the lower back and placed on meloxicam, cyclobenzaprine, and prednisone. Today, she is c/o numbness in the face, dizziness, tingling in the throat, and dry mouth onset yesterday.

## 2017-04-09 NOTE — ED Provider Notes (Signed)
TIME SEEN: 11:22 PM  CHIEF COMPLAINT: Possible medication reaction  HPI: Patient is a 46 year old female with history of migraines, arthritis who presents emergency department with complaints of possible medication reaction. Patient reports that she saw an outpatient physician and chiropractor for thoracic back pain which told she had arthritis. Was started on Motrin, Flexeril and prednisone taper. States she's been taking these medications since Monday, July 30. States she took Flexeril and prednisone today at 3 PM and then 30 minutes later developed numbness in both cheeks, dry mouth, palpitations and felt very nervous. She felt like her lips were tingling. No other numbness, tingling or focal weakness. Forehead was not involved. No facial asymmetry. No changes in speech or vision. No headache. No chest pain or shortness of breath. She states she feels her symptoms are slowly improving. No rash, lip or tongue swelling, difficulty breathing, wheezing, lightheadedness. States her back is already feeling much better.   ROS: See HPI Constitutional: no fever  Eyes: no drainage  ENT: no runny nose   Cardiovascular:  no chest pain  Resp: no SOB  GI: no vomiting GU: no dysuria Integumentary: no rash  Allergy: no hives  Musculoskeletal: no leg swelling  Neurological: no slurred speech ROS otherwise negative  PAST MEDICAL HISTORY/PAST SURGICAL HISTORY:  Past Medical History:  Diagnosis Date  . Arthritis   . Dysmenorrhea   . Migraines     MEDICATIONS:  Prior to Admission medications   Medication Sig Start Date End Date Taking? Authorizing Provider  cyclobenzaprine (FLEXERIL) 10 MG tablet Take 1 tablet (10 mg total) by mouth 3 (three) times daily as needed for muscle spasms. 04/05/17  Yes McVey, Madelaine BhatElizabeth Whitney, PA-C  meloxicam (MOBIC) 15 MG tablet Take 1 tablet (15 mg total) by mouth daily. 04/05/17  Yes McVey, Madelaine BhatElizabeth Whitney, PA-C  predniSONE (DELTASONE) 20 MG tablet Take 3 PO QAM  x3days, 2 PO QAM x3days, 1 PO QAM x3days 04/05/17  Yes McVey, Madelaine BhatElizabeth Whitney, PA-C    ALLERGIES:  No Known Allergies  SOCIAL HISTORY:  Social History  Substance Use Topics  . Smoking status: Former Smoker    Years: 1.00    Quit date: 04/07/2013  . Smokeless tobacco: Never Used  . Alcohol use Yes    FAMILY HISTORY: Family History  Problem Relation Age of Onset  . Kidney disease Mother        dialysis  . Hypotension Mother   . Diabetes Father   . Breast cancer Neg Hx     EXAM: BP 119/79 (BP Location: Left Arm)   Pulse 70   Temp 98 F (36.7 C) (Oral)   Resp 15   Ht 5' (1.524 m)   Wt 58.1 kg (128 lb)   LMP 03/29/2017 (Approximate)   SpO2 98%   BMI 25.00 kg/m  CONSTITUTIONAL: Alert and oriented and responds appropriately to questions. Well-appearing; well-nourished HEAD: Normocephalic EYES: Conjunctivae clear, pupils appear equal, EOMI ENT: normal nose; moist mucous membranes; No pharyngeal erythema or petechiae, no tonsillar hypertrophy or exudate, no uvular deviation, no unilateral swelling, no trismus or drooling, no muffled voice, normal phonation, no stridor, no dental caries present, no drainable dental abscess noted, no Ludwig's angina, tongue sits flat in the bottom of the mouth, no angioedema, no facial erythema or warmth, no facial swelling; no pain with movement of the neck. NECK: Supple, no meningismus, no nuchal rigidity, no LAD  CARD: RRR; S1 and S2 appreciated; no murmurs, no clicks, no rubs, no gallops RESP: Normal chest excursion  without splinting or tachypnea; breath sounds clear and equal bilaterally; no wheezes, no rhonchi, no rales, no hypoxia or respiratory distress, speaking full sentences ABD/GI: Normal bowel sounds; non-distended; soft, non-tender, no rebound, no guarding, no peritoneal signs, no hepatosplenomegaly BACK:  The back appears normal and is non-tender to palpation, there is no CVA tenderness EXT: Normal ROM in all joints; non-tender to  palpation; no edema; normal capillary refill; no cyanosis, no calf tenderness or swelling    SKIN: Normal color for age and race; warm; no rash, no urticaria NEURO: Moves all extremities equally; Strength 5/5 in all four extremities.  Normal sensation diffusely other than feeling like there is some diminished sensation in her cheeks bilaterally but normal sensation over her forehead.  CN 2-12 grossly intact.  Normal speech.  Normal gait. PSYCH: The patient's mood and manner are appropriate. Grooming and personal hygiene are appropriate.  MEDICAL DECISION MAKING: Patient here with symptoms that may be from adverse reaction from prednisone or Flexeril. No signs of allergic reaction.  She still reports having some numbness over both cheeks. Discussed with her that this is unlikely a stroke given it is bilateral and does not involve the forehead. I suspect some of this is related to anxiety and patient and friend at bedside agree. Given she has had some palpitations, will check EKG and basic labs. Continue to monitor patient. I do not feel she needs emergent head imaging. Her back pain has resolved and other than some numbness over both cheeks she is neurologically intact.  ED PROGRESS: Patient's labs are unremarkable. Normal electrolytes, hemoglobin. Negative troponin. Negative pregnancy test. Normal EKG. She is slowly improving with time. I suspect this is a medication reaction. Discussed with her that this is not an allergic reaction. She has had 3 days of 60 mg of prednisone and 2 days of 40 mg of prednisone. Given this is equivalent to a 5 day burst, I feel she is safe to stop this medication. Have advised her that she does not need to take the Mobic or Flexeril anymore since she is no longer having back pain and she can take these as needed. Discussed return precautions.   At this time, I do not feel there is any life-threatening condition present. I have reviewed and discussed all results (EKG, imaging,  lab, urine as appropriate) and exam findings with patient/family. I have reviewed nursing notes and appropriate previous records.  I feel the patient is safe to be discharged home without further emergent workup and can continue workup as an outpatient as needed. Discussed usual and customary return precautions. Patient/family verbalize understanding and are comfortable with this plan.  Outpatient follow-up has been provided if needed. All questions have been answered.    EKG Interpretation  Date/Time:  Friday April 09 2017 23:33:00 EDT Ventricular Rate:  63 PR Interval:    QRS Duration: 81 QT Interval:  418 QTC Calculation: 428 R Axis:   85 Text Interpretation:  Sinus rhythm Confirmed by Rochele RaringWard, Kristen [phone]([id number]) on 04/09/2017 11:57:00 PM          Ward, Layla MawKristen N, DO 04/10/17 0018

## 2017-04-09 NOTE — ED Triage Notes (Signed)
Pt state she was seen by a dr on Monday and was started on Flexeril, prednisone, and meloxicam for arthritis in her back  Pt states Thursday she started not feeling well  Pt states today she has numbness in her face, dry mouth, dizziness, and when she talks her throat feels like it is trying to close up

## 2017-04-10 NOTE — Discharge Instructions (Signed)
You were seen in the emergency department for medication reaction likely to either prednisone or cyclobenzaprine. It is safe for you to stop taking prednisone. You may also stop taking meloxicam and cyclobenzaprine and only use them as needed if you continue to have back pain. Your labs today and EKG were normal. I feel you're safe to be discharged home and follow-up with her outpatient primary care provider as needed. This is not an allergic reaction. You do not need any further medications today.

## 2017-05-26 ENCOUNTER — Ambulatory Visit (INDEPENDENT_AMBULATORY_CARE_PROVIDER_SITE_OTHER): Payer: Self-pay | Admitting: Internal Medicine

## 2017-05-26 ENCOUNTER — Encounter: Payer: Self-pay | Admitting: Internal Medicine

## 2017-05-26 VITALS — BP 100/60 | HR 70 | Resp 17 | Ht 60.24 in | Wt 130.0 lb

## 2017-05-26 DIAGNOSIS — M1990 Unspecified osteoarthritis, unspecified site: Secondary | ICD-10-CM | POA: Insufficient documentation

## 2017-05-26 DIAGNOSIS — M546 Pain in thoracic spine: Secondary | ICD-10-CM

## 2017-05-26 DIAGNOSIS — M7711 Lateral epicondylitis, right elbow: Secondary | ICD-10-CM

## 2017-05-26 DIAGNOSIS — G8929 Other chronic pain: Secondary | ICD-10-CM

## 2017-05-26 MED ORDER — MELOXICAM 15 MG PO TABS: 15.0000 mg | ORAL_TABLET | Freq: Every day | ORAL | 6 refills | Status: DC

## 2017-05-26 NOTE — Progress Notes (Signed)
Subjective:    Patient ID: Sarah Andrews, female    DOB: 02/02/1971, 46 y.o.   MRN: [id number]  HPI  Here to establish Friend, Amparo, interprets.  Pain in back and right arm for 4-5 months:  No history of injury she recalls.  States before the pain started, however, her son gave her a massage after a long day of work and the pain seemed to start from there. Was seen for same 04/05/2017 and diagnosed with cervical radiculopathy.  Started on Prednisone burst and taper as well as cyclobenzaprine, Meloxicam.  Took the prednisone for 5 days, and felt dizzy, dry mouth and like her throat was closing on her, so stopped.  Feels the other 2 medications help a little bit.   Points to mainly her mid back, but also having pain down her right arm.  Points to her shoulder, triceps area and lateral forearm.  Does not feel this pain is a numbness or tingling.  Points to lateral elbow as area where her arm is weak, but may more so be pain.    Current Meds  Medication Sig  . cyclobenzaprine (FLEXERIL) 10 MG tablet Take 1 tablet (10 mg total) by mouth 3 (three) times daily as needed for muscle spasms.  . meloxicam (MOBIC) 15 MG tablet Take 1 tablet (15 mg total) by mouth daily. With a meal  . [DISCONTINUED] meloxicam (MOBIC) 15 MG tablet Take 1 tablet (15 mg total) by mouth daily.    No Active Allergies   Past Medical History:  Diagnosis Date  . Arthritis    Mild OA changes T6-T7  . Dysmenorrhea   . Migraines     No past surgical history on file.   Family History  Problem Relation Age of Onset  . Kidney disease Mother        dialysis, she is not certain of cause  . Hypotension Mother   . Diabetes Father   . Diabetes Sister   . Kidney disease Brother     Social History   Social History  . Marital status: Single    Spouse name: N/A  . Number of children: 1  . Years of education: 2 years college-accounting   Occupational History  . Cleans homes    Social History Main Topics   . Smoking status: Former Smoker    Years: 1.00    Quit date: 04/07/2013  . Smokeless tobacco: Never Used  . Alcohol use Yes  . Drug use: No  . Sexual activity: Not Currently    Birth control/ protection: None     Comment: wants to become pregnant   Other Topics Concern  . Not on file   Social History Narrative   Originally from [place]   Came to [place] in 2002   Lives with her 65 yo son.   He goes to [place]        Review of Systems     Objective:   Physical Exam NAD HEENT:  PERRL, EOMI, TMs pearly gray, throat without injection. Neck: Supple, no adenopathy, not thyromegaly Chest:  CTA CV:  RRR with normal S1 and S2, No S3, S4 or murmur.  Radial and DP pulses normal and equal MS:  NT down spinous processes of cervical and thoracic spine.  Tender over paraspinous musculature of thoracic back, mainly between scapulae. Tender over right lateral epicondyle and tendinous insertion.  Increased pain at the site with resistance against pronation.       Assessment & Plan:  1.  Thoracic back pain, right mainly.  Meloxicam daily as needed with meal.   Referral to [facility] for  #1 and #2  2.  Right lateral epicondylitis:  Arm band, Meloxicam

## 2017-05-26 NOTE — Progress Notes (Signed)
SW Intern completed social determinants of health screening tool and shared information about accessing counseling services and contact information. No notable concerns were reported.

## 2017-06-28 ENCOUNTER — Encounter (HOSPITAL_COMMUNITY): Payer: Self-pay

## 2017-07-04 DIAGNOSIS — M546 Pain in thoracic spine: Principal | ICD-10-CM

## 2017-07-04 DIAGNOSIS — M7711 Lateral epicondylitis, right elbow: Secondary | ICD-10-CM | POA: Insufficient documentation

## 2017-07-04 DIAGNOSIS — G8929 Other chronic pain: Secondary | ICD-10-CM | POA: Insufficient documentation

## 2017-08-26 ENCOUNTER — Ambulatory Visit: Payer: Self-pay | Admitting: Internal Medicine

## 2018-02-04 ENCOUNTER — Ambulatory Visit: Payer: Self-pay | Admitting: Emergency Medicine

## 2018-02-05 ENCOUNTER — Ambulatory Visit (INDEPENDENT_AMBULATORY_CARE_PROVIDER_SITE_OTHER): Payer: Self-pay | Admitting: Urgent Care

## 2018-02-05 ENCOUNTER — Other Ambulatory Visit: Payer: Self-pay

## 2018-02-05 ENCOUNTER — Encounter: Payer: Self-pay | Admitting: Urgent Care

## 2018-02-05 VITALS — BP 90/60 | HR 66 | Temp 98.6°F | Resp 16 | Ht 60.5 in | Wt 127.4 lb

## 2018-02-05 DIAGNOSIS — R5383 Other fatigue: Secondary | ICD-10-CM

## 2018-02-05 DIAGNOSIS — G4700 Insomnia, unspecified: Secondary | ICD-10-CM

## 2018-02-05 DIAGNOSIS — R12 Heartburn: Secondary | ICD-10-CM

## 2018-02-05 DIAGNOSIS — R1013 Epigastric pain: Secondary | ICD-10-CM

## 2018-02-05 MED ORDER — HYDROXYZINE HCL 25 MG PO TABS: 25.0000 mg | ORAL_TABLET | Freq: Every evening | ORAL | 1 refills | Status: DC | PRN

## 2018-02-05 NOTE — Patient Instructions (Addendum)
Insomnio  (Insomnia)  El insomnio es un trastorno del sueo que causa dificultades para conciliar el sueo o para mantenerlo. Puede producir cansancio (fatiga), falta de energa, dificultad para concentrarse, cambios en el estado de nimo y mal rendimiento escolar o laboral.  Hay tres formas diferentes de clasificar el insomnio:   Dificultad para conciliar el sueo.   Dificultad para mantener el sueo.   Despertar muy precoz por la maana.  Cualquier tipo de insomnio puede ser a largo plazo (crnico) o a corto plazo (agudo). Ambos son frecuentes. Generalmente, el insomnio a corto plazo dura tres meses o menos tiempo. El crnico ocurre al menos tres veces por semana durante ms de tres meses.  CAUSAS  El insomnio puede deberse a otra afeccin, situacin o sustancia, por ejemplo:   Ansiedad.   Algunos medicamentos.   Enfermedad por reflujo gastroesofgico (ERGE) u otras enfermedades gastrointestinales.   Asma y otras enfermedades respiratorias.   Sndrome de las piernas inquietas, apnea del sueo u otros trastornos del sueo.   Dolor crnico.   Menopausia, que puede incluir calores repentinos.   Ictus.   Consumo excesivo de alcohol, tabaco u drogas ilegales.   Depresin.   Cafena.   Trastornos neurolgicos, como enfermedad de Alzheimer.   Hiperactividad tiroidea (hipertiroidismo).  Es posible que la causa del insomnio no se conozca.  FACTORES DE RIESGO  Los factores de riesgo de tener insomnio incluyen lo siguiente:   El sexo. La mujeres se ven ms afectadas que los hombres.   La edad. El insomnio es ms frecuente a medida que una persona envejece.   El estrs. Esto puede incluir su vida profesional o personal.   Los ingresos. El insomnio es ms frecuente en las personas cuyos ingresos son ms bajos.   La falta de actividad fsica.   Los horarios de trabajo irregulares o los turnos nocturnos.   Los viajes a lugares de diferentes zonas horarias.  SIGNOS Y SNTOMAS  Si tiene insomnio, el sntoma  principal es la dificultad para conciliar el sueo o mantenerlo. Esto puede derivar en otros sntomas, por ejemplo:   Sentirse fatigado.   Ponerse nervioso por tener que irse a dormir.   No sentirse descansado por la maana.   Tener dificultad para concentrarse.   Sentirse irritable, ansioso o deprimido.  TRATAMIENTO  El tratamiento para el insomnio depende de la causa. Si se debe a una enfermedad preexistente, el tratamiento se centrar en el abordaje de la enfermedad. El tratamiento tambin puede incluir lo siguiente:   Medicamentos que lo ayuden a dormir.   Asesoramiento psicolgico o terapia.   Cambios en el estilo de vida.  INSTRUCCIONES PARA EL CUIDADO EN EL HOGAR   Tome los medicamentos solamente como se lo haya indicado el mdico.   Establezca horarios habituales para dormir y despertarse. No tome siestas.   Lleve un registro del sueo ya que podra ser de utilidad para que usted y a su mdico puedan determinar qu podra estar causndole insomnio. Incluya lo siguiente:  ? Cundo duerme.  ? Cundo se despierta durante la noche.  ? Qu tan bien duerme.  ? Qu tan relajado se siente al da siguiente.  ? Cualquier efecto secundario de los medicamentos que toma.  ? Lo que usted come y bebe.   Convierta a su habitacin en un lugar cmodo donde sea fcil conciliar el sueo:  ? Coloque persianas o cortinas especiales oscuras que impidan la entrada de la luz del exterior.  ? Para bloquear los ruidos, use   usted. Estos pueden incluir lo siguiente: ? Ejercicios de respiracin. ? Rutinas para aliviar la tensin muscular. ? Visualizacin de escenas  apacibles.  Disminucin del consumo de alcohol, bebidas con cafena y cigarrillos, especialmente cerca de la hora de [place], ya que pueden perturbarle el sueo.  No coma en exceso ni consuma comidas picantes justo antes de la hora de [place]. Esto puede causarle molestias digestivas y dificultades para dormir.  Limite el uso de pantallas antes de la hora de [place]. Esto incluye lo siguiente: ? Mirar televisin. ? Usar el telfono inteligente, la tableta y la computadora.  Siga una rutina. Esto puede ayudarlo a conciliar el sueo ms rpidamente. Intente hacer una actividad tranquila, cepillarse los dientes e irse a la cama a la misma hora todas las noches.  Levntese de la cama si sigue despierto despus de de haber intentado dormirse. Mantenga bajas las luces, pero intente leer o hacer una actividad tranquila. Cuando tenga sueo, regrese a Pharmacist, hospital.  Conduzca con cuidado. No conduzca si est muy somnoliento.  Concurra a todas las visitas de control, segn le indique su mdico. Esto es importante. SOLICITE ATENCIN MDICA SI:  Est cansado durante el da o tiene dificultades en su rutina diaria debido a la somnolencia.  Sigue teniendo problemas para dormir o Teacher, English as a foreign language. SOLICITE ATENCIN MDICA DE INMEDIATO SI:  Tiene pensamientos serios acerca de lastimarse a usted mismo o daar a Engineer, maintenance (IT). Esta informacin no tiene Theme park manager el consejo del mdico. Asegrese de hacerle al mdico cualquier pregunta que tenga. Document Released: 08/24/2005 Document Revised: 09/14/2014 Document Reviewed: 05/25/2014 Elsevier Interactive Patient Education  2018 [employer].     Hydroxyzine capsules or tablets Qu es este medicamento? La HIDROXIZINA es un antihistamnico. Este medicamento se [place] para tratar los sntomas de [place]. Tambin se [place] para tratar la ansiedad y tensin. Se puede Retail banker en combinacin con otros medicamentos para  inducir el sueo antes de una operacin [place]. Este medicamento puede ser utilizado para otros usos; si tiene alguna pregunta consulte con su proveedor de atencin mdica o con su farmacutico. MARCAS COMUNES: ANX, Atarax, Rezine, Vistaril Qu le debo informar a mi profesional de la salud antes de tomar este medicamento? Necesita saber si usted presenta alguno de los siguientes problemas o situaciones: -otras enfermedades crnicas -dificultad para Geographical information systems officer -glaucoma -enfermedad cardiaca -enfermedad renal -enfermedad heptica -enfermedad pulmonar -una reaccin alrgica o inusual a la hidroxizina, a la cetirizina, a otros medicamentos, alimentos, colorantes o conservantes -si est embarazada o buscando quedar embarazada -si est amamantando a un beb Cmo debo utilizar este medicamento? Tome este medicamento por va oral con un vaso lleno de agua. Siga las instrucciones de la etiqueta del [place]. Este medicamento se puede tomar con alimentos o con el estmago vaco. Tome sus dosis a intervalos regulares. No tome su medicamento con una frecuencia mayor a la indicada. Hable con su pediatra para informarse acerca del uso de este medicamento en nios. Puede requerir atencin especial. Aunque este medicamento ha sido recetado a nios tan menores como de 6 aos de edad para condiciones selectivas, las precauciones se aplican. Los pacientes de ms de 65 aos de edad pueden presentar reacciones ms fuertes y Pension scheme manager dosis [employer]. Sobredosis: Pngase en contacto inmediatamente con un centro toxicolgico o una sala de urgencia si usted cree que haya tomado demasiado medicamento. ATENCIN: [employer] es solo para usted. No comparta este medicamento con nadie. Qu sucede si me olvido de una dosis? Si olvida una dosis,  tmela lo antes posible. Si es casi la hora de la prxima dosis, tome slo esa dosis. No tome dosis adicionales o dobles. Qu puede interactuar con este  medicamento? -alcohol -barbitricos para el sueo o para las convulsiones -medicamentos para resfros o Environmental consultantalergias -medicamentos para la depresin, ansiedad o trastornos emocionales -analgsicos -medicamentos para conciliar el sueo -relajantes musculares Puede ser que esta lista no menciona todas las posibles interacciones. Informe a su profesional de Beazer Homesla salud de [employer] Inctodos los productos a base de hierbas, medicamentos de [place] libre o suplementos nutritivos que est tomando. Si usted fuma, consume bebidas alcohlicas o si utiliza drogas ilegales, indqueselo tambin a su profesional de Beazer Homesla salud. Algunas sustancias pueden interactuar con su medicamento. A qu debo estar atento al usar PPL Corporationeste medicamento? Si los sntomas no mejoran, consulte a su mdico o a su profesional de Beazer Homesla salud. Puede experimentar mareos o somnolencia. No conduzca ni utilice maquinaria ni haga nada que Scientist, research (life sciences)le exija permanecer en estado de alerta hasta que sepa cmo le afecta este medicamento. No se siente ni se ponga de pie con rapidez, especialmente si es un paciente de edad avanzada. Esto reduce el riesgo de mareos o Newell Rubbermaiddesmayos. El alcohol puede interferir con el efecto de [place] medicamento. Evite consumir bebidas alcohlicas. Se le podr secar la boca. Masticar chicle sin azcar, chupar caramelos duros y tomar agua en abundancia le ayudar a mantener la boca hmeda. Si el problema no desaparece o es severo, consulte a su mdico. Este medicamento puede resecarle los ojos y provocar visin borrosa. Si [place] lentes de contacto, puede sentir ciertas molestias. Las gotas lubricantes pueden ser tiles. Si el problema no desaparece o es severo, consulte a su mdico de los ojos. Si se est realizando pruebas cutneas debido a una alergia, informe a su mdico que est recibiendo PPL Corporationeste medicamento. Qu efectos secundarios puedo tener al Boston Scientificutilizar este medicamento? Efectos secundarios que debe informar a su mdico o a Producer, television/film/videosu profesional de la salud tan  pronto como sea posible: -pulso cardiaco rpido o irregular -dificultad para orinar -convulsiones -hablar arrastrando las palabras o confusin -temblores Efectos secundarios que, por lo general, no requieren Psychologist, prison and probation servicesatencin mdica (debe informarlos a su mdico o a su profesional de la salud si persisten o si son molestos): -estreimiento -somnolencia -fatiga -dolor de cabeza -Programme researcher, broadcasting/film/videomalestar estomacal Puede ser que esta lista no menciona todos los posibles efectos secundarios. Comunquese a su mdico por asesoramiento mdico Hewlett-Packardsobre los efectos secundarios. Usted puede informar los efectos secundarios a la FDA por telfono al 1-800-FDA-1088. Dnde debo guardar mi medicina? Mantngala fuera del alcance de los nios. Gurdela a Sanmina-SCItemperatura ambiente, entre 15 y 30 grados C ([phone] y [phone] grados F). Mantenga el envase bien cerrado. Deseche todo el medicamento que no haya utilizado, despus de la fecha de vencimiento. ATENCIN: Este folleto es un resumen. Puede ser que no cubra toda la posible informacin. Si usted tiene preguntas acerca de esta medicina, consulte con su mdico, su farmacutico o su profesional de Radiographer, therapeuticla salud.  2018 Elsevier/Gold Standard (2014-10-16 00:00:00)

## 2018-02-05 NOTE — Progress Notes (Signed)
    MRN: [id number] DOB: 04/25/1971  Subjective:   Sarah Andrews is a 47 y.o. female presenting for GERD, fatigue, insomnia.   Epigastric pain - Reports 2 month history of epigastric pain that has improved over time. However, patient reports that she has ongoing sour brash, throat discomfort consistently after eating. Denies n/v, constipation, diarrhea. Admits that she has intermittent loose stools. Has previously tried omeprazole for ~1 month, has been off this for 1 month now.  Insomnia - Reports 4 year history of persistent difficulty sleeping, worse in the past 3 months. Sleep is restless, sometimes just gets 4 hours of sleep. Has had fatigue and irritability as a result. Has had crying spells. Denies big stressors. She has a boyfriend, 1 son. Feels good with them. However, she has lost interest in sex with her boyfriend. Denies feeling anxious. Sleep hygiene is good. States that she does not watch tv in bed, go over finances. Has 1 drink of alcohol per month. Smokes ~1 cigarette per month. Has 1 cup of coffee in the morning. Denies panic attacks, chest pain, heart racing, diaphoresis.   Sarah Andrews is not currently taking any medications. Also is allergic to prednisone.  Sarah Andrews  has a past medical history of Arthritis, Dysmenorrhea, and Migraines. Denies past surgical history.   Objective:   Vitals: BP 90/60   Pulse 66   Temp 98.6 F (37 C) (Oral)   Resp 16   Ht 5' 0.5" (1.537 m)   Wt 127 lb 6 oz (57.8 kg)   LMP 01/14/2018   SpO2 99%   BMI 24.47 kg/m   Physical Exam  Constitutional: She is oriented to person, place, and time. She appears well-developed and well-nourished.  HENT:  Mouth/Throat: Oropharynx is clear and moist.  Eyes: Right eye exhibits no discharge. Left eye exhibits no discharge.  Neck: Normal range of motion. Neck supple. No thyromegaly present.  Cardiovascular: Normal rate, regular rhythm and intact distal pulses. Exam reveals no gallop and no friction  rub.  No murmur heard. Pulmonary/Chest: No respiratory distress. She has no wheezes. She has no rales.  Neurological: She is alert and oriented to person, place, and time.  Skin: Skin is warm and dry.  Psychiatric:  Flat affect, tearful at times.   Assessment and Plan :   Other fatigue - Plan: Comprehensive metabolic panel, Thyroid Panel With TSH, CBC  Insomnia, unspecified type  Heartburn - Plan: H. pylori breath test  Abdominal pain, epigastric - Plan: H. pylori breath test  Labs pending, patient has symptoms of depression. For now, she would like to await labs before starting medications for this. She is to use Vistaril for her insomnia. Will hold off on additional treatments for GERD pending labs. Follow up with results.  Wallis BambergMario Jayleon Mcfarlane, PA-C [facility] [phone] 02/05/2018  8:35 AM

## 2018-02-06 LAB — COMPREHENSIVE METABOLIC PANEL
ALBUMIN: 4.2 g/dL (ref 3.5–5.5)
ALK PHOS: 52 IU/L (ref 39–117)
ALT: 10 IU/L (ref 0–32)
AST: 12 IU/L (ref 0–40)
Albumin/Globulin Ratio: 1.4 (ref 1.2–2.2)
BUN / CREAT RATIO: 19 (ref 9–23)
BUN: 11 mg/dL (ref 6–24)
Bilirubin Total: 0.3 mg/dL (ref 0.0–1.2)
CO2: 20 mmol/L (ref 20–29)
Calcium: 9 mg/dL (ref 8.7–10.2)
Chloride: 103 mmol/L (ref 96–106)
Creatinine, Ser: 0.59 mg/dL (ref 0.57–1.00)
GFR, EST AFRICAN AMERICAN: 128 mL/min/{1.73_m2} (ref >59)
GFR, EST NON AFRICAN AMERICAN: 111 mL/min/{1.73_m2} (ref >59)
GLOBULIN, TOTAL: 3 g/dL (ref 1.5–4.5)
Glucose: 92 mg/dL (ref 65–99)
Potassium: 4.2 mmol/L (ref 3.5–5.2)
SODIUM: 139 mmol/L (ref 134–144)
TOTAL PROTEIN: 7.2 g/dL (ref 6.0–8.5)

## 2018-02-06 LAB — THYROID PANEL WITH TSH
FREE THYROXINE INDEX: 2.2 (ref 1.2–4.9)
T3 Uptake Ratio: 27 % (ref 24–39)
T4, Total: 8.3 ug/dL (ref 4.5–12.0)
TSH: 1.97 u[IU]/mL (ref 0.450–4.500)

## 2018-02-06 LAB — CBC
HEMATOCRIT: 40.1 % (ref 34.0–46.6)
HEMOGLOBIN: 12.6 g/dL (ref 11.1–15.9)
MCH: 30.3 pg (ref 26.6–33.0)
MCHC: 31.4 g/dL — ABNORMAL LOW (ref 31.5–35.7)
MCV: 96 fL (ref 79–97)
Platelets: 296 10*3/uL (ref 150–450)
RBC: 4.16 x10E6/uL (ref 3.77–5.28)
RDW: 13.9 % (ref 12.3–15.4)
WBC: 8 10*3/uL (ref 3.4–10.8)

## 2018-02-08 ENCOUNTER — Telehealth: Payer: Self-pay | Admitting: Urgent Care

## 2018-02-08 MED ORDER — AMOXICILLIN 500 MG PO CAPS: 1000.0000 mg | ORAL_CAPSULE | Freq: Two times a day (BID) | ORAL | 0 refills | Status: DC

## 2018-02-08 MED ORDER — AMOXICILLIN 500 MG PO CAPS: 500.0000 mg | ORAL_CAPSULE | Freq: Three times a day (TID) | ORAL | 0 refills | Status: DC

## 2018-02-08 MED ORDER — OMEPRAZOLE 20 MG PO CPDR: 20.0000 mg | DELAYED_RELEASE_CAPSULE | Freq: Two times a day (BID) | ORAL | 0 refills | Status: DC

## 2018-02-08 MED ORDER — OMEPRAZOLE 20 MG PO CPDR: 20.0000 mg | DELAYED_RELEASE_CAPSULE | Freq: Every day | ORAL | 3 refills | Status: DC

## 2018-02-08 MED ORDER — CLARITHROMYCIN 500 MG PO TABS: 500.0000 mg | ORAL_TABLET | Freq: Two times a day (BID) | ORAL | 0 refills | Status: DC

## 2018-02-08 NOTE — Addendum Note (Signed)
Addended by: [name], Bionca Mckey on: 02/08/2018 02:44 PM   Modules accepted: Orders

## 2018-02-08 NOTE — Telephone Encounter (Signed)
Per provider, medications cancelled at pharmacy and re-ordered - verbal given.

## 2018-02-09 ENCOUNTER — Telehealth: Payer: Self-pay

## 2018-02-09 LAB — H. PYLORI BREATH COLLECTION

## 2018-02-09 LAB — H. PYLORI BREATH TEST: H pylori Breath Test: POSITIVE — AB

## 2018-02-09 NOTE — Telephone Encounter (Signed)
Copied from CRM [id number]. Topic: General - Other >> Feb 08, 2018  3:53 PM Elliot GaultBell, Tiffany M wrote: Relation to pt: self  Call back number: [phone] [facility] [address] - [place], [place] - [address] [phone] (Phone) [phone] (Fax)  Reason for call:  Patient was last seen 02/05/18 and states she cant remember if PCP put her on a diet plan, please advise >> Feb 08, 2018  3:59 PM Elliot GaultBell, Tiffany M wrote: Relation to pt: self  Call back number: [phone] [facility] [address] - [place], [place] - [address] [phone] (Phone) [phone] (Fax)  Reason for call:  Patient was last seen 02/05/18 and states she cant remember if PCP put her on a diet plan, please advise

## 2018-02-09 NOTE — Telephone Encounter (Signed)
Pt advised that due to her reflux she should avoid foods high in fat content and food containing caffeine as these can make the reflux worse.

## 2018-03-01 ENCOUNTER — Ambulatory Visit: Payer: Self-pay | Admitting: Urgent Care

## 2018-03-01 ENCOUNTER — Encounter: Payer: Self-pay | Admitting: Urgent Care

## 2018-03-01 VITALS — BP 107/68 | HR 84 | Temp 98.7°F | Resp 17 | Ht 60.0 in | Wt 131.0 lb

## 2018-03-01 DIAGNOSIS — R12 Heartburn: Secondary | ICD-10-CM

## 2018-03-01 DIAGNOSIS — A048 Other specified bacterial intestinal infections: Secondary | ICD-10-CM

## 2018-03-01 MED ORDER — OMEPRAZOLE 20 MG PO CPDR: 20.0000 mg | DELAYED_RELEASE_CAPSULE | Freq: Every day | ORAL | 0 refills | Status: DC

## 2018-03-01 NOTE — Progress Notes (Signed)
    MRN: [id number] DOB: [birth year]  Subjective:   Sarah Andrews is a 47 y.o. female presenting for follow up on H. pylori infection. She finished triple therapy. Today, she reports that she did well while on the medication. ~1 week later she started having the reflux type symptoms. Had difficulty eating yesterday due to her reflux symptoms but not today. Feels a burning type sensation in her esophagus with foods. Has tried avoiding acidic foods.  However, she does drink beer.  Denies fever, epigastric pain, nausea, vomiting, diarrhea, bloody stools.  Sarah Andrews is not currently taking medications. Also is allergic to prednisone.  Sarah Andrews  has a past medical history of Arthritis, Dysmenorrhea, and Migraines. Denies past surgical history.  Objective:   Vitals: BP 107/68   Pulse 84   Temp 98.7 F (37.1 C) (Oral)   Resp 17   Ht 5' (1.524 m)   Wt 131 lb (59.4 kg)   SpO2 98%   BMI 25.58 kg/m   Physical Exam  Constitutional: She is oriented to person, place, and time. She appears well-developed and well-nourished.  Cardiovascular: Normal rate.  Pulmonary/Chest: Effort normal.  Neurological: She is alert and oriented to person, place, and time.  Psychiatric: She has a normal mood and affect.    Assessment and Plan :   H. pylori infection  Heartburn  We will restart Prilosec as she may be having a rebound effect from having taken it twice daily.  Patient is to return to clinic if her symptoms are not improved over the next 2 to 4 weeks.  Wallis BambergMario Lavern Maslow, PA-C [facility] [phone] 03/01/2018 5:18 PM

## 2018-03-01 NOTE — Patient Instructions (Addendum)
Espere dos semanas para ver si 20mg  de omeprazole le ayuda con su acides. Despues de Marsh & McLennandos semanas, si sigue padeciendo de sus sintomas, puede aumentar la dosis a 40mg , seria 2 pastillas al dia.     Opciones de alimentos para pacientes adultos con enfermedad de reflujo gastroesofgico Food Choices for Gastroesophageal Reflux Disease, Adult Si tiene enfermedad de reflujo gastroesofgico (ERGE), los alimentos que consume y los hbitos de alimentacin son muy importantes. Elegir los alimentos adecuados puede ayudar a Paramedicaliviar las molestias ocasionadas por la Hartford FinancialERGE. Considere la posibilidad de trabajar con un especialista en dieta y nutricin (nutricionista) que lo ayude a Software engineerhacer elecciones saludables. Qu pautas generales debo seguir? Plan de alimentacin  Elija alimentos saludables con bajo contenido de grasa, como frutas, verduras, cereales integrales, productos lcteos descremados, carne magra de vaca, de pescado y de ave.  Haga comidas pequeas con frecuencia en lugar de tres comidas abundantes al da. Coma lentamente, en un ambiente distendido. Evite agacharse o recostarse hasta despus de 2 o 3horas de haber comido.  Limite los alimentos con alto contenido graso como las carnes grasas o los alimentos fritos.  Limite el consumo de Spottsvilleaceites, Britt[place] Far[place]-The Highlandsmanteca y Indiamargarina a menos de 8 cucharaditas al Futures traderda.  Evite lo siguiente: ? Consumir alimentos que le ocasionen sntomas. Pueden ser distintos para cada persona. Lleve un registro de los alimentos para identificar aquellos que le ocasionen sntomas. ? Consumir alcohol. ? Beber grandes cantidades de lquido con las comidas. ? Comer 2 o 3 horas antes de acostarse.  Cocine los alimentos utilizando mtodos que no sean la fritura. Esto puede Writerincluir hornear, Technical sales engineeremparrillar y hervir. Estilo de vida   Mantenga un peso saludable. Pregunte a su mdico cul es el peso saludable para usted. Si debe perder peso, hable con su mdico para hacerlo de manera  segura.  Realice actividad fsica durante, al menos, 30 minutos 5 das por semana o ms, o segn lo indicado por su mdico.  Evite usar ropa ajustada alrededor de la cintura y Naval architectel pecho.  No consuma ningn producto que contenga nicotina o tabaco, como cigarrillos y Administrator, Civil Servicecigarrillos electrnicos. Si necesita ayuda para dejar de fumar, consulte al mdico.  Duerma con la cabecera de la cama elevada. Use una cua debajo del colchn o bloques debajo del armazn de la cama para Pharmacologistmantener la cabecera de la cama elevada. Qu alimentos no se recomiendan? Esta podra no ser Raytheonuna lista completa. Hable con el nutricionista sobre las mejores opciones alimenticias para usted. Carbohidratos Pasteles o panes sin levadura con grasa agregada. Tostadas francesas. Verduras Verduras fritas en abundante aceite. Papas fritas. Cualquier verdura que est preparada con grasa agregada. Cualquier verdura que le ocasione sntomas. Para algunas personas, estas pueden incluir tomates y productos con tomate, Wilsonvilleajes, cebollas y Youngs[place]ajo, y rbanos picantes. Frutas Cualquier fruta que est preparada con grasa agregada. Cualquier fruta que le ocasione sntomas. Para algunas personas, estas pueden incluir, las frutas ctricas como naranja, pomelo, pia y limn. Carnes y otros alimentos ricos en protenas Carnes de alto contenido graso como carne grasa de vaca o cerdo, salchichas, costillas, Cambridgejamn, salchicha, salame y tocino. Carnes o protenas fritas, lo que incluye pescado frito y pollo frito. Nueces y Villanuevamantequillas de frutos secos. Lcteos Leche entera y Caddo Millsleche con chocolate. PPG IndustriesCrema cida. Crema. Helados. Queso crema. Batidos con WPS Resourcesleche. Bebidas Caf y t negro, con o sin cafena. Bebidas carbonatadas. Refrescos. Bebidas energizantes. Jugo de fruta hecho con frutas cidas (como naranja o pomelo). Jugo de tomate. Bebidas alcohlicas. Grasas y Barnes & Nobleaceites Mant[place].  Margarina. Lardo. Mantequilla clarificada. Dulces y postres Chocolate y cacao.  Rosquillas. Condimentos y otros alimentos [place]. Menta y mentol. Cualquier condimento, hierbas o aderezos que le ocasionen sntomas. Para algunas personas, esto puede incluir curry, salsa picante o aderezos para ensalada a base de vinagre. Resumen  Si tiene enfermedad de reflujo gastroesofgico (ERGE), las elecciones de alimentos y Learned de vida son muy importantes para ayudar a Paramedic las molestias de la [place].  Haga comidas pequeas con frecuencia en lugar de tres comidas abundantes al da. Coma lentamente, en un ambiente distendido. Evite agacharse o recostarse hasta despus de 2 o 3horas de haber comido.  Limite los alimentos con alto contenido graso como la carne grasa o los alimentos fritos. Esta informacin no tiene Theme park manager el consejo del mdico. Asegrese de hacerle al mdico cualquier pregunta que tenga. Document Released: 06/03/2005 Document Revised: 12/14/2016 Document Reviewed: 06/28/2013 Elsevier Interactive Patient Education  2018 Arvin[employer].     IF you received an x-ray today, you will receive an invoice from [facility]. Please contact [facility] at [phone] with questions or concerns regarding your invoice.   IF you received labwork today, you will receive an invoice from [facility]. Please contact [facility] at [phone] with questions or concerns regarding your invoice.   Our billing staff will not be able to assist you with questions regarding bills from these companies.  You will be contacted with the lab results as soon as they are available. The fastest way to get your results is to activate your My Chart account. Instructions are located on the last page of this paperwork. If you have not heard from [place] regarding the results in 2 weeks, please contact this office.

## 2018-03-31 ENCOUNTER — Ambulatory Visit: Payer: Self-pay | Admitting: Urgent Care

## 2018-04-16 ENCOUNTER — Ambulatory Visit: Payer: Self-pay | Admitting: Emergency Medicine

## 2018-04-16 ENCOUNTER — Encounter: Payer: Self-pay | Admitting: Emergency Medicine

## 2018-04-16 ENCOUNTER — Ambulatory Visit: Payer: Self-pay | Admitting: Urgent Care

## 2018-04-16 ENCOUNTER — Other Ambulatory Visit: Payer: Self-pay

## 2018-04-16 VITALS — BP 90/56 | HR 65 | Temp 98.1°F | Resp 16 | Ht 60.5 in | Wt 128.4 lb

## 2018-04-16 DIAGNOSIS — R12 Heartburn: Secondary | ICD-10-CM | POA: Insufficient documentation

## 2018-04-16 DIAGNOSIS — R109 Unspecified abdominal pain: Secondary | ICD-10-CM

## 2018-04-16 DIAGNOSIS — R1013 Epigastric pain: Secondary | ICD-10-CM | POA: Insufficient documentation

## 2018-04-16 LAB — POCT URINALYSIS DIP (MANUAL ENTRY)
Bilirubin, UA: NEGATIVE
GLUCOSE UA: NEGATIVE mg/dL
Ketones, POC UA: NEGATIVE mg/dL
LEUKOCYTES UA: NEGATIVE
NITRITE UA: NEGATIVE
Protein Ur, POC: NEGATIVE mg/dL
Spec Grav, UA: 1.02 (ref 1.010–1.025)
UROBILINOGEN UA: 0.2 U/dL
pH, UA: 7 (ref 5.0–8.0)

## 2018-04-16 LAB — POCT URINE PREGNANCY: PREG TEST UR: NEGATIVE

## 2018-04-16 MED ORDER — RANITIDINE HCL 300 MG PO TABS: 300.0000 mg | ORAL_TABLET | Freq: Every day | ORAL | 1 refills | Status: DC

## 2018-04-16 NOTE — Patient Instructions (Addendum)
  Continue omeprazole and start Zantac 300 mg at bedtime.   IF you received an x-ray today, you will receive an [facility]. Please contact [facility] at [phone] with questions or concerns regarding your invoice.   IF you received labwork today, you will receive an invoice from [facility]. Please contact [facility] at [phone] with questions or concerns regarding your invoice.   Our billing staff will not be able to assist you with questions regarding bills from these companies.  You will be contacted with the lab results as soon as they are available. The fastest way to get your results is to activate your My Chart account. Instructions are located on the last page of this paperwork. If you have not heard from us regarding the results in 2 weeks, please contact this office.     Gastritis en los adultos (Gastritis, Adult) La gastritis es la irritacin (inflamacin) del estmago. Si tiene Kohl'sesta afeccin, puede presentar algunos de estos problemas (sntomas):  Dolor estomacal.  Sensacin de ardor en el estmago.  Ganas de vomitar (nuseas).  Vmitos.  Sensacin de estar demasiado lleno despus de comer. Es importante recibir ayuda para Copyesta afeccin. Sin ayuda, el Honeywellestmago puede sangrar y se pueden formar llagas (lceras). CUIDADOS EN EL HOGAR  Tome los medicamentos de venta libre y los recetados solamente como se lo haya indicado el mdico.  Si le recetaron un antibitico, tmelo como se lo haya indicado el mdico. No deje de tomarlo aunque comience a sentirse mejor.  Beba suficiente lquido para mantener el pis (orina) claro o de color amarillo plido.  En lugar de comer en gran cantidad, prepare pequeas porciones de comida. SOLICITE AYUDA SI:  Los problemas empeoran.  Los problemas desaparecen, pero luego vuelven a Research officer, trade unionaparecer. SOLICITE AYUDA DE INMEDIATO SI:  Vomita sangre o una sustancia parecida a los granos de Rosevillecaf.  La materia fecal  (heces) es negra o de color rojo oscuro.  No puede retener los lquidos.  El dolor de Midlandestmago empeora.  Tiene fiebre.  No mejora luego de 1 semana. Esta informacin no tiene Theme park managercomo fin reemplazar el consejo del mdico. Asegrese de hacerle al mdico cualquier pregunta que tenga. Document Released: 02/23/2012 Document Revised: 05/18/2015 Document Reviewed: 05/18/2015 Elsevier Interactive Patient Education  [employer]2018 Elsevier Inc.

## 2018-04-16 NOTE — Progress Notes (Signed)
Sarah Andrews 47 y.o.   Chief Complaint  Patient presents with  . Abdominal Pain    stomach x 1 month radiate to back     HISTORY OF PRESENT ILLNESS: This is a 47 y.o. female with a history of H. pylori infection, treated last June, completed course of treatment.  Presently taking omeprazole 20 mg in the morning.  Occasionally and especially if she has a heavy late dinner she will wake up around 4 in the morning with epigastric burning pain with some radiation to the back.  Pain lasts about 2 hours, unable to go back to sleep, and goes away spontaneously.  Not every night.  Still able to eat and drink.  Denies nausea or vomiting.  Denies melena or rectal bleeding.  No fever or chills.  No other significant symptoms.  HPI   Prior to Admission medications   Medication Sig Start Date End Date Taking? Authorizing Provider  omeprazole (PRILOSEC) 20 MG capsule Take 1 capsule (20 mg total) by mouth daily. 03/01/18  Yes Sarah Bamberg, PA-C  clarithromycin (BIAXIN) 500 MG tablet Take 1 tablet (500 mg total) by mouth 2 (two) times daily. Patient not taking: Reported on 04/16/2018 02/08/18   Sarah Bamberg, PA-C  hydrOXYzine (ATARAX/VISTARIL) 25 MG tablet Take 1-2 tablets (25-50 mg total) by mouth at bedtime as needed. Patient not taking: Reported on 04/16/2018 02/05/18   Sarah Bamberg, PA-C  ranitidine (ZANTAC) 300 MG tablet Take 1 tablet (300 mg total) by mouth at bedtime for 14 days. 04/16/18 04/30/18  Georgina Quint, MD    Allergies  Allergen Reactions  . Prednisone     Dizziness, dry mouth and "throat closing, but not swelling"    Patient Active Problem List   Diagnosis Date Noted  . Chronic right-sided thoracic back pain 07/04/2017  . Right lateral epicondylitis 07/04/2017  . Arthritis   . Low grade squamous intraepithelial lesion (LGSIL) on cervical Pap smear 02/10/2016  . Dysmenorrhea 04/08/2011  . Dysfunctional uterine bleeding 04/08/2011    Past Medical History:  Diagnosis  Date  . Arthritis    Mild OA changes T6-T7  . Dysmenorrhea   . Migraines     No past surgical history on file.  Social History   Socioeconomic History  . Marital status: Single    Spouse name: Not on file  . Number of children: 1  . Years of education: 2 years college-accounting  . Highest education level: Not on file  Occupational History  . Occupation: Lexicographer homes  Social Needs  . Financial resource strain: Not on file  . Food insecurity:    Worry: Not on file    Inability: Not on file  . Transportation needs:    Medical: Not on file    Non-medical: Not on file  Tobacco Use  . Smoking status: Former Smoker    Years: 1.00    Last attempt to quit: 04/07/2013    Years since quitting: 5.0  . Smokeless tobacco: Never Used  Substance and Sexual Activity  . Alcohol use: Yes  . Drug use: No  . Sexual activity: Not Currently    Birth control/protection: None    Comment: wants to become pregnant  Lifestyle  . Physical activity:    Days per week: Not on file    Minutes per session: Not on file  . Stress: Not on file  Relationships  . Social connections:    Talks on phone: Not on file    Gets together: Not on file  Attends religious service: Not on file    Active member of club or organization: Not on file    Attends meetings of clubs or organizations: Not on file    Relationship status: Not on file  . Intimate partner violence:    Fear of current or ex partner: Not on file    Emotionally abused: Not on file    Physically abused: Not on file    Forced sexual activity: Not on file  Other Topics Concern  . Not on file  Social History Narrative   Originally from [place]   Came to [place] in 2002   Lives with her 43 yo son.   He goes to [place]        Family History  Problem Relation Age of Onset  . Kidney disease Mother        dialysis, she is not certain of cause  . Hypotension Mother   . Diabetes Father   . Diabetes Sister   . Kidney disease  Brother      Review of Systems  Constitutional: Negative.  Negative for chills and fever.  HENT: Negative.  Negative for congestion, nosebleeds and sore throat.   Eyes: Negative.   Respiratory: Negative.  Negative for cough and shortness of breath.   Cardiovascular: Negative.  Negative for chest pain and palpitations.  Gastrointestinal: Positive for abdominal pain and heartburn. Negative for blood in stool, diarrhea, melena, nausea and vomiting.  Genitourinary: Negative.  Negative for dysuria.  Skin: Negative.  Negative for rash.  Neurological: Negative.  Negative for dizziness and headaches.  Endo/Heme/Allergies: Negative.   All other systems reviewed and are negative.   Vitals:   04/16/18 0936  BP: (!) 90/56  Pulse: 65  Resp: 16  Temp: 98.1 F (36.7 C)  SpO2: 100%    Physical Exam  Constitutional: She is oriented to person, place, and time. She appears well-developed and well-nourished.  HENT:  Head: Normocephalic and atraumatic.  Nose: Nose normal.  Mouth/Throat: Oropharynx is clear and moist.  Eyes: Pupils are equal, round, and reactive to light. Conjunctivae and EOM are normal.  Neck: Normal range of motion. Neck supple.  Cardiovascular: Normal rate, regular rhythm and normal heart sounds.  Pulmonary/Chest: Effort normal and breath sounds normal.  Abdominal: Soft. Bowel sounds are normal. She exhibits no distension and no mass. There is no tenderness. There is no guarding.  Musculoskeletal: Normal range of motion. She exhibits no edema or tenderness.  Neurological: She is alert and oriented to person, place, and time. No sensory deficit. She exhibits normal muscle tone.  Skin: Skin is warm and dry. Capillary refill takes less than 2 seconds. No rash noted.  Psychiatric: She has a normal mood and affect. Her behavior is normal.  Vitals reviewed.    ASSESSMENT & PLAN: [name] was seen today for abdominal pain.  Diagnoses and all orders for this visit:  Epigastric  pain  Stomach pain -     POCT urinalysis dipstick -     POCT urine pregnancy  Heartburn  Other orders -     ranitidine (ZANTAC) 300 MG tablet; Take 1 tablet (300 mg total) by mouth at bedtime for 14 days.    Patient Instructions    Continue omeprazole and start Zantac 300 mg at bedtime.   IF you received an x-ray today, you will receive an invoice from [facility]. Please contact [facility] at [phone] with questions or concerns regarding your invoice.   IF you received labwork today,  you will receive an invoice from [facility]. Please contact [facility] at [phone] with questions or concerns regarding your invoice.   Our billing staff will not be able to assist you with questions regarding bills from these companies.  You will be contacted with the lab results as soon as they are available. The fastest way to get your results is to activate your My Chart account. Instructions are located on the last page of this paperwork. If you have not heard from us regarding the results in 2 weeks, please contact this office.     Gastritis en los adultos (Gastritis, Adult) La gastritis es la irritacin (inflamacin) del estmago. Si tiene Kohl'sesta afeccin, puede presentar algunos de estos problemas (sntomas):  Dolor estomacal.  Sensacin de ardor en el estmago.  Ganas de vomitar (nuseas).  Vmitos.  Sensacin de estar demasiado lleno despus de comer. Es importante recibir ayuda para Copyesta afeccin. Sin ayuda, el Honeywellestmago puede sangrar y se pueden formar llagas (lceras). CUIDADOS EN EL HOGAR  Tome los medicamentos de venta libre y los recetados solamente como se lo haya indicado el mdico.  Si le recetaron un antibitico, tmelo como se lo haya indicado el mdico. No deje de tomarlo aunque comience a sentirse mejor.  Beba suficiente lquido para mantener el pis (orina) claro o de color amarillo plido.  En lugar de comer en gran cantidad, prepare pequeas  porciones de comida. SOLICITE AYUDA SI:  Los problemas empeoran.  Los problemas desaparecen, pero luego vuelven a Research officer, trade unionaparecer. SOLICITE AYUDA DE INMEDIATO SI:  Vomita sangre o una sustancia parecida a los granos de Olivetcaf.  La materia fecal (heces) es negra o de color rojo oscuro.  No puede retener los lquidos.  El dolor de Three Riversestmago empeora.  Tiene fiebre.  No mejora luego de 1 semana. Esta informacin no tiene Theme park managercomo fin reemplazar el consejo del mdico. Asegrese de hacerle al mdico cualquier pregunta que tenga. Document Released: 02/23/2012 Document Revised: 05/18/2015 Document Reviewed: 05/18/2015 Elsevier Interactive Patient Education  2018 Elsevier Inc.      Edwina BarthMiguel Nari Vannatter, MD [facility]

## 2018-05-13 ENCOUNTER — Ambulatory Visit: Payer: Self-pay | Admitting: Emergency Medicine

## 2018-08-12 ENCOUNTER — Telehealth (HOSPITAL_COMMUNITY): Payer: Self-pay

## 2018-08-12 ENCOUNTER — Ambulatory Visit (HOSPITAL_COMMUNITY)
Admission: EM | Admit: 2018-08-12 | Discharge: 2018-08-12 | Disposition: A | Discharge status: Home / Self Care | Payer: Self-pay | Attending: Emergency Medicine | Admitting: Emergency Medicine

## 2018-08-12 ENCOUNTER — Encounter (HOSPITAL_COMMUNITY): Payer: Self-pay

## 2018-08-12 DIAGNOSIS — H00011 Hordeolum externum right upper eyelid: Secondary | ICD-10-CM

## 2018-08-12 MED ORDER — ERYTHROMYCIN 5 MG/GM OP OINT: TOPICAL_OINTMENT | OPHTHALMIC | 1 refills | Status: DC

## 2018-08-12 MED ORDER — ERYTHROMYCIN 5 MG/GM OP OINT
TOPICAL_OINTMENT | OPHTHALMIC | 1 refills | Status: DC
Start: 2018-08-12 — End: 2019-05-30

## 2018-08-12 NOTE — ED Provider Notes (Signed)
[facility]    CSN: [id number] Arrival date & time: 08/12/18  1905     History   Chief Complaint Chief Complaint  Patient presents with  . Eye Problem    HPI Sarah Andrews is a 47 y.o. female.   Has had a red raised area to RT upper eye lid for 1 week is becoming larger, painful, had minimal drainage from area. Has not applied anything pta. Denies any visual problems, to eye pressure. Has itching to area      Past Medical History:  Diagnosis Date  . Arthritis    Mild OA changes T6-T7  . Dysmenorrhea   . Migraines     Patient Active Problem List   Diagnosis Date Noted  . Stomach pain 04/16/2018  . Epigastric pain 04/16/2018  . Heartburn 04/16/2018  . Chronic right-sided thoracic back pain 07/04/2017  . Right lateral epicondylitis 07/04/2017  . Arthritis   . Low grade squamous intraepithelial lesion (LGSIL) on cervical Pap smear 02/10/2016  . Dysmenorrhea 04/08/2011  . Dysfunctional uterine bleeding 04/08/2011    History reviewed. No pertinent surgical history.  OB History    Gravida  2   Para  1   Term  1   Preterm  0   AB  1   Living  1     SAB  1   TAB  0   Ectopic  0   Multiple      Live Births  1            Home Medications    Prior to Admission medications   Medication Sig Start Date End Date Taking? Authorizing Provider  clarithromycin (BIAXIN) 500 MG tablet Take 1 tablet (500 mg total) by mouth 2 (two) times daily. Patient not taking: Reported on 04/16/2018 02/08/18   Wallis Bamberg, PA-C  erythromycin ophthalmic ointment Place a 1/2 inch ribbon of ointment into the upper eyelid. 08/12/18   Coralyn Mark, NP  hydrOXYzine (ATARAX/VISTARIL) 25 MG tablet Take 1-2 tablets (25-50 mg total) by mouth at bedtime as needed. Patient not taking: Reported on 04/16/2018 02/05/18   Wallis Bamberg, PA-C  omeprazole (PRILOSEC) 20 MG capsule Take 1 capsule (20 mg total) by mouth daily. 03/01/18   Wallis Bamberg, PA-C  ranitidine  (ZANTAC) 300 MG tablet Take 1 tablet (300 mg total) by mouth at bedtime for 14 days. 04/16/18 04/30/18  Georgina Quint, MD    Family History Family History  Problem Relation Age of Onset  . Kidney disease Mother        dialysis, she is not certain of cause  . Hypotension Mother   . Diabetes Father   . Diabetes Sister   . Kidney disease Brother     Social History Social History   Tobacco Use  . Smoking status: Former Smoker    Years: 1.00    Last attempt to quit: 04/07/2013    Years since quitting: 5.3  . Smokeless tobacco: Never Used  Substance Use Topics  . Alcohol use: Yes  . Drug use: No     Allergies   Prednisone   Review of Systems Review of Systems  Constitutional: Negative.   HENT: Negative.   Eyes:       RT upper eye lid area red raised area causing itching and irritation. With minimal drainage   Respiratory: Negative.   Cardiovascular: Negative.   Gastrointestinal: Negative.   Genitourinary: Negative.   Neurological: Negative.      Physical Exam Triage  Vital Signs ED Triage Vitals  Enc Vitals Group     BP 08/12/18 1937 124/69     Pulse Rate 08/12/18 1937 85     Resp 08/12/18 1937 19     Temp 08/12/18 1937 98.7 F (37.1 C)     Temp src --      SpO2 08/12/18 1937 100 %     Weight --      Height --      Head Circumference --      Peak Flow --      Pain Score 08/12/18 1935 2     Pain Loc --      Pain Edu? --      Excl. in GC? --    No data found.  Updated Vital Signs BP 124/69   Pulse 85   Temp 98.7 F (37.1 C)   Resp 19   LMP 07/24/2018   SpO2 100%   Visual Acuity Right Eye Distance:  20/20 Left Eye Distance:  20/20 Bilateral Distance:    Right Eye Near:   Left Eye Near:    Bilateral Near:     Physical Exam  Constitutional: She appears well-developed.  HENT:  Head: Normocephalic.  Eyes: Pupils are equal, round, and reactive to light.  RT upper bleph area has a small raised area to rt upper lid, minimal clear drainage  noted. No visual dif, able to open and close lid.   Neck: Normal range of motion.  Cardiovascular: Normal rate.  Pulmonary/Chest: Effort normal.  Abdominal: Soft.  Neurological: She is alert.  Skin: There is erythema.     UC Treatments / Results  Labs (all labs ordered are listed, but only abnormal results are displayed) Labs Reviewed - No data to display  EKG None  Radiology No results found.  Procedures Procedures (including critical care time)  Medications Ordered in UC Medications - No data to display  Initial Impression / Assessment and Plan / UC Course  I have reviewed the triage vital signs and the nursing notes.  Pertinent labs & imaging results that were available during my care of the patient were reviewed by me and considered in my medical decision making (see chart for details).     Apply warm compresses to eye 4 times a day  Wash hands  Avoid popping area  If you begin to have any eye pain or pressure go to the ER  Final Clinical Impressions(s) / UC Diagnoses   Final diagnoses:  Hordeolum externum of right upper eyelid     Discharge Instructions     Apply warm compresses to eye 4 times a day  Wash hands  Avoid popping area     ED Prescriptions    Medication Sig Dispense Auth. Provider   erythromycin ophthalmic ointment Place a 1/2 inch ribbon of ointment into the upper eyelid. 1 g Coralyn MarkMitchell, Tava Peery L, NP     Controlled Substance Prescriptions [place] Controlled Substance Registry consulted? Not Applicable   Coralyn MarkMitchell, Calahan Pak L, NP 08/12/18 2032

## 2018-08-12 NOTE — ED Triage Notes (Signed)
Pt presents with complaints of eye problem x 2 weeks. Patient has a bump on her right eye. Denies any vision loss.

## 2018-08-12 NOTE — Discharge Instructions (Addendum)
Apply warm compresses to eye 4 times a day  Wash hands  Avoid popping area

## 2018-08-18 ENCOUNTER — Ambulatory Visit: Payer: Self-pay | Admitting: Physician Assistant

## 2018-09-06 ENCOUNTER — Encounter (HOSPITAL_COMMUNITY): Payer: Self-pay | Admitting: *Deleted

## 2019-01-18 ENCOUNTER — Telehealth: Payer: Self-pay | Admitting: Emergency Medicine

## 2019-01-18 ENCOUNTER — Telehealth: Payer: Self-pay | Admitting: *Deleted

## 2019-01-18 ENCOUNTER — Encounter: Payer: Self-pay | Admitting: Emergency Medicine

## 2019-01-18 ENCOUNTER — Other Ambulatory Visit: Payer: Self-pay

## 2019-01-18 NOTE — Telephone Encounter (Signed)
Called patient using Interpreter Lawanna Kobus [phone], to triage for appointment. Left message in mobile voice mail to call back.

## 2019-01-18 NOTE — Telephone Encounter (Signed)
Called to start telemedicine visit.  No answer.  Left message.

## 2019-01-23 ENCOUNTER — Ambulatory Visit (INDEPENDENT_AMBULATORY_CARE_PROVIDER_SITE_OTHER): Payer: Self-pay | Admitting: Emergency Medicine

## 2019-01-23 ENCOUNTER — Encounter: Payer: Self-pay | Admitting: Emergency Medicine

## 2019-01-23 ENCOUNTER — Other Ambulatory Visit: Payer: Self-pay

## 2019-01-23 VITALS — BP 100/60 | HR 75 | Temp 98.5°F | Resp 18 | Ht 60.5 in | Wt 138.0 lb

## 2019-01-23 DIAGNOSIS — Z8619 Personal history of other infectious and parasitic diseases: Secondary | ICD-10-CM

## 2019-01-23 DIAGNOSIS — K279 Peptic ulcer, site unspecified, unspecified as acute or chronic, without hemorrhage or perforation: Secondary | ICD-10-CM

## 2019-01-23 DIAGNOSIS — R1013 Epigastric pain: Secondary | ICD-10-CM

## 2019-01-23 MED ORDER — OMEPRAZOLE 40 MG PO CPDR: 40.0000 mg | DELAYED_RELEASE_CAPSULE | Freq: Every day | ORAL | 2 refills | Status: DC

## 2019-01-23 MED ORDER — AMOXICILLIN 500 MG PO CAPS: 1000.0000 mg | ORAL_CAPSULE | Freq: Two times a day (BID) | ORAL | 0 refills | Status: AC

## 2019-01-23 MED ORDER — CLARITHROMYCIN 500 MG PO TABS: 500.0000 mg | ORAL_TABLET | Freq: Two times a day (BID) | ORAL | 0 refills | Status: DC

## 2019-01-23 NOTE — Patient Instructions (Addendum)
If you have lab work done today you will be contacted with your lab results within the next 2 weeks.  If you have not heard from us then please contact us. The fastest way to get your results is to register for My Chart.   IF you received an x-ray today, you will receive an invoice from [facility]. Please contact [facility] at [phone] with questions or concerns regarding your invoice.   IF you received labwork today, you will receive an invoice from [facility]. Please contact [facility] at [phone] with questions or concerns regarding your invoice.   Our billing staff will not be able to assist you with questions regarding bills from these companies.  You will be contacted with the lab results as soon as they are available. The fastest way to get your results is to activate your My Chart account. Instructions are located on the last page of this paperwork. If you have not heard from us regarding the results in 2 weeks, please contact this office.     Dieta suave Bland Diet Una dieta suave se compone de alimentos que, en general, son blandos y no tienen [place]ucha grasa, fibra ni condimentos adicionales. Para el cuerpo, es ms fcil digerir alimentos sin grasa, fibra o condimentos. Adems, es menos probable que estos causen Sanmina-SCIirritacin en la boca, la garganta, el [place]mago y otras partes de aparato digestivo. A menudo, se conoce a la dieta [address] como dieta BRAT ([place]anas, [place]ice, Applesauce, [place]ast [bananas, arroz, pur de [place]zana y pan tostado]). En qu consiste el plan? Su mdico o especialista en nutricin y alimentos (nutricionista) pueden recomendar cambios especficos en su dieta para evitar o tratar sus sntomas. Estos cambios pueden incluir lo siguiente:  Consumir pequeas cantidades de comida con frecuencia.  Cocinar los alimentos hasta que estn lo bastante blandos para masticarlos con facilidad.  Masticar bien la comida.  Beber lquidos lentamente.  No  consumir alimentos muy picantes, cidos o grasosos.  No comer frutas ctricas, como naranjas y toronjas. Qu debo saber acerca de esta dieta?  Consuma diferentes alimentos de la lista de alimentos de la dieta [place].  No siga una dieta suave durante ms tiempo del necesario.  Pregntele a su mdico si debe tomar vitaminas o suplementos. Qu alimentos puedo comer? Cereales  Cereales calientes, como crema de trigo. Arroz. Panes, galletas o tortillas elaborados con harina blanca refinada. Verduras Verduras cocidas o enlatadas. Pur de papas o papas hervidas. Nils PyleFrutas  Bananas. Pur de Praxairmanzana. Otros tipos de frutas cocidas o enlatadas peladas y sin semillas, por ejemplo, duraznos o peras en lata. Carnes y otras protenas  Huevos revueltos. Mantequilla de man cremosa u otras mantequillas de frutos secos. Carnes Corning Incorporatedmagras bien cocidas, como ave o pescado. Tofu. Sopas o caldos. Lcteos Productos lcteos sin grasa, como [place], queso cottage y Dentistyogur. Owens CorningBebidas  [place]gu[place]. T de hierbas. Jugo de [place]manzana. Grasas y aceites Aderezos suaves para ensaladas. Aceite de canola o de oliva. Dulces y postres Pudin. Natillas. Gelatina de frutas. Helados. Es posible que los productos mencionados arriba no formen una lista completa de las bebidas o los alimentos recomendados. Comunquese con un nutricionista para conocer ms opciones. Qu alimentos no se recomiendan? Cereales Panes y cereales integrales. Verduras Verduras crudas. Frutas Frutas crudas, especialmente los ctricos, frutos rojos o frutas secas. Lcteos Productos lcteos enteros. Bebidas Bebidas que contengan cafena. Alcohol. Alios y condimentos Aderezos o condimentos muy saborizados. Salsa picante. Salsa. Otros alimentos Comidas picantes. Comidas fritas. Alimentos cidos, como encurtidos o  alimentos fermentados. Alimentos con alto contenido de International aid/development worker. Alimentos ricos [employer]. Es posible que los productos que se enumeran ms arriba no  sean una lista completa de los alimentos y las bebidas que se Theatre stage manager. Comunquese con un nutricionista para obtener ms informacin. Resumen  Una dieta suave se compone de alimentos que, en general, son blandos y no tienen [place] grasa, fibra ni condimentos adicionales.  Para el cuerpo, es ms fcil digerir alimentos sin grasa, fibra o condimentos.  Consulte a su mdico para ver cunto tiempo debe seguir este plan de alimentacin. Esta dieta no est destinada a seguirse [employer]. Esta informacin no tiene Theme park manager el consejo del mdico. Asegrese de hacerle al mdico cualquier pregunta que tenga. Document Released: 12/16/2015 Document Revised: 10/28/2017 Document Reviewed: 10/28/2017 Elsevier Interactive Patient Education  2019 [employer].

## 2019-01-23 NOTE — Progress Notes (Signed)
Sarah Andrews 48 y.o.   Chief Complaint  Patient presents with  . Abdominal Pain    per patient upper area with burning x 3 weeks    HISTORY OF PRESENT ILLNESS: This is a 48 y.o. female complaining of epigastric burning sensation for the past 3 weeks.  Patient has a history of peptic ulcer disease, diagnosed with H. pylori infection last summer.  Treated successfully with antibiotics and PPI.  Did well for some time but symptoms started coming back the last several weeks.  Denies nausea or vomiting.  Denies melena or rectal bleeding.  Denies syncopal episode.  No other significant symptoms.  HPI   Prior to Admission medications   Medication Sig Start Date End Date Taking? Authorizing Provider  omeprazole (PRILOSEC) 20 MG capsule Take 1 capsule (20 mg total) by mouth daily. 03/01/18  Yes Wallis Bamberg, PA-C  clarithromycin (BIAXIN) 500 MG tablet Take 1 tablet (500 mg total) by mouth 2 (two) times daily. Patient not taking: Reported on 01/23/2019 02/08/18   Wallis Bamberg, PA-C  erythromycin ophthalmic ointment Place a 1/2 inch ribbon of ointment into the upper eyelid. Patient not taking: Reported on 01/23/2019 08/12/18   Eustace Moore, MD  hydrOXYzine (ATARAX/VISTARIL) 25 MG tablet Take 1-2 tablets (25-50 mg total) by mouth at bedtime as needed. Patient not taking: Reported on 01/23/2019 02/05/18   Wallis Bamberg, PA-C  ranitidine (ZANTAC) 300 MG tablet Take 1 tablet (300 mg total) by mouth at bedtime for 14 days. 04/16/18 04/30/18  Georgina Quint, MD    Allergies  Allergen Reactions  . Prednisone     Dizziness, dry mouth and "throat closing, but not swelling"    Patient Active Problem List   Diagnosis Date Noted  . Chronic right-sided thoracic back pain 07/04/2017  . Arthritis   . Low grade squamous intraepithelial lesion (LGSIL) on cervical Pap smear 02/10/2016  . Dysfunctional uterine bleeding 04/08/2011    Past Medical History:  Diagnosis Date  . Arthritis    Mild  OA changes T6-T7  . Dysmenorrhea   . Migraines     History reviewed. No pertinent surgical history.  Social History   Socioeconomic History  . Marital status: Single    Spouse name: Not on file  . Number of children: 1  . Years of education: 2 years college-accounting  . Highest education level: Not on file  Occupational History  . Occupation: Lexicographer homes  Social Needs  . Financial resource strain: Not on file  . Food insecurity:    Worry: Not on file    Inability: Not on file  . Transportation needs:    Medical: Not on file    Non-medical: Not on file  Tobacco Use  . Smoking status: Former Smoker    Years: 1.00    Last attempt to quit: 04/07/2013    Years since quitting: 5.8  . Smokeless tobacco: Never Used  Substance and Sexual Activity  . Alcohol use: Yes  . Drug use: No  . Sexual activity: Not Currently    Birth control/protection: None    Comment: wants to become pregnant  Lifestyle  . Physical activity:    Days per week: Not on file    Minutes per session: Not on file  . Stress: Not on file  Relationships  . Social connections:    Talks on phone: Not on file    Gets together: Not on file    Attends religious service: Not on file    Active member  of club or organization: Not on file    Attends meetings of clubs or organizations: Not on file    Relationship status: Not on file  . Intimate partner violence:    Fear of current or ex partner: Not on file    Emotionally abused: Not on file    Physically abused: Not on file    Forced sexual activity: Not on file  Other Topics Concern  . Not on file  Social History Narrative   Originally from [place]   Came to [place] in 2002   Lives with her 31 yo son.   He goes to [place]        Family History  Problem Relation Age of Onset  . Kidney disease Mother        dialysis, she is not certain of cause  . Hypotension Mother   . Diabetes Father   . Diabetes Sister   . Kidney disease Brother       Review of Systems  Constitutional: Negative.  Negative for chills, fever and weight loss.  HENT: Negative.  Negative for congestion, nosebleeds and sore throat.   Eyes: Negative.   Respiratory: Negative.  Negative for cough and shortness of breath.   Cardiovascular: Negative.  Negative for chest pain and palpitations.  Gastrointestinal: Positive for abdominal pain, heartburn and nausea. Negative for blood in stool, diarrhea, melena and vomiting.  Genitourinary: Negative.  Negative for dysuria and hematuria.  Musculoskeletal: Negative.   Skin: Negative.  Negative for rash.  Neurological: Negative for dizziness, loss of consciousness and headaches.  All other systems reviewed and are negative.  Vitals:   01/23/19 1559  BP: 100/60  Pulse: 75  Resp: 18  Temp: 98.5 F (36.9 C)  SpO2: 97%     Physical Exam Vitals signs reviewed.  Constitutional:      Appearance: She is well-developed.  HENT:     Head: Normocephalic and atraumatic.  Eyes:     Extraocular Movements: Extraocular movements intact.     Pupils: Pupils are equal, round, and reactive to light.  Cardiovascular:     Rate and Rhythm: Normal rate and regular rhythm.  Pulmonary:     Effort: Pulmonary effort is normal.     Breath sounds: Normal breath sounds.  Abdominal:     General: Abdomen is flat. Bowel sounds are normal.     Palpations: Abdomen is soft.     Tenderness: There is abdominal tenderness in the epigastric area. There is no guarding or rebound.  Skin:    General: Skin is warm and dry.     Capillary Refill: Capillary refill takes less than 2 seconds.  Neurological:     General: No focal deficit present.     Mental Status: She is alert and oriented to person, place, and time.  Psychiatric:        Mood and Affect: Mood normal.        Behavior: Behavior normal.      ASSESSMENT & PLAN: Most likely this represents recurrence of peptic ulcer disease and H. pylori infection.  Has been taking PPI omeprazole  20 mg up until 3 days ago.  Given her past history and present symptoms I recommend to start H. pylori treatment with Biaxin and amoxicillin with high-dose PPI omeprazole 40 mg daily for the next 2 weeks. Porshe was seen today for abdominal pain.  Diagnoses and all orders for this visit:  Epigastric pain -     clarithromycin (BIAXIN) 500 MG tablet; Take  1 tablet (500 mg total) by mouth 2 (two) times daily. -     amoxicillin (AMOXIL) 500 MG capsule; Take 2 capsules (1,000 mg total) by mouth 2 (two) times daily for 14 days. -     omeprazole (PRILOSEC) 40 MG capsule; Take 1 capsule (40 mg total) by mouth daily for 14 days.  Peptic ulcer disease -     clarithromycin (BIAXIN) 500 MG tablet; Take 1 tablet (500 mg total) by mouth 2 (two) times daily. -     amoxicillin (AMOXIL) 500 MG capsule; Take 2 capsules (1,000 mg total) by mouth 2 (two) times daily for 14 days. -     omeprazole (PRILOSEC) 40 MG capsule; Take 1 capsule (40 mg total) by mouth daily for 14 days.  History of Helicobacter pylori infection -     clarithromycin (BIAXIN) 500 MG tablet; Take 1 tablet (500 mg total) by mouth 2 (two) times daily. -     amoxicillin (AMOXIL) 500 MG capsule; Take 2 capsules (1,000 mg total) by mouth 2 (two) times daily for 14 days. -     omeprazole (PRILOSEC) 40 MG capsule; Take 1 capsule (40 mg total) by mouth daily for 14 days.    Patient Instructions       If you have lab work done today you will be contacted with your lab results within the next 2 weeks.  If you have not heard from [place] then please contact us. The fastest way to get your results is to register for My Chart.   IF you received an x-ray today, you will receive an invoice from [facility]. Please contact [facility] at [phone] with questions or concerns regarding your invoice.   IF you received labwork today, you will receive an invoice from [place]. Please contact [facility] at [phone] with questions or  concerns regarding your invoice.   Our billing staff will not be able to assist you with questions regarding bills from these companies.  You will be contacted with the lab results as soon as they are available. The fastest way to get your results is to activate your My Chart account. Instructions are located on the last page of this paperwork. If you have not heard from [place] regarding the results in 2 weeks, please contact this office.     Dieta suave Bland Diet Una dieta suave se compone de alimentos que, en general, son blandos y no tienen [place] grasa, fibra ni condimentos adicionales. Para el cuerpo, es ms fcil digerir alimentos sin grasa, fibra o condimentos. Adems, es menos probable que estos causen [employer] boca, la garganta, el [place] y otras partes de aparato digestivo. A menudo, se conoce a la dieta [address] ([place], [place], Applesauce, [place], arroz, pur de [place] y pan tostado]). En qu consiste el plan? Su mdico o especialista en nutricin y alimentos (nutricionista) pueden recomendar cambios especficos en su dieta para evitar o tratar sus sntomas. Estos cambios pueden incluir lo siguiente:  Consumir pequeas cantidades de comida con frecuencia.  Cocinar los alimentos hasta que estn lo bastante blandos para masticarlos con facilidad.  Masticar bien la comida.  Beber lquidos lentamente.  No consumir alimentos muy picantes, cidos o grasosos.  No comer frutas ctricas, como naranjas y toronjas. Qu debo saber acerca de esta dieta?  Consuma diferentes alimentos de la lista de alimentos de la dieta [place].  No siga una dieta suave durante ms tiempo del necesario.  Pregntele a su mdico si debe tomar  vitaminas o suplementos. Qu alimentos puedo comer? Cereales  Cereales calientes, como crema de trigo. Arroz. Panes, galletas o tortillas elaborados con harina blanca refinada. Verduras Verduras cocidas o enlatadas. Pur de papas o  papas hervidas. Nils PyleFrutas  Bananas. Pur de Praxairmanzana. Otros tipos de frutas cocidas o enlatadas peladas y sin semillas, por ejemplo, duraznos o peras en lata. Carnes y otras protenas  Huevos revueltos. Mantequilla de man cremosa u otras mantequillas de frutos secos. Carnes Corning Incorporatedmagras bien cocidas, como ave o pescado. Tofu. Sopas o caldos. Lcteos Productos lcteos sin grasa, como [place], queso cottage y Dentistyogur. Owens CorningBebidas  [place]. T de hierbas. Jugo de [place]manzana. Grasas y aceites Aderezos suaves para ensaladas. Aceite de canola o de oliva. Dulces y postres Pudin. Natillas. Gelatina de frutas. Helados. Es posible que los productos mencionados arriba no formen una lista completa de las bebidas o los alimentos recomendados. Comunquese con un nutricionista para conocer ms opciones. Qu alimentos no se recomiendan? Cereales Panes y cereales integrales. Verduras Verduras crudas. Frutas Frutas crudas, especialmente los ctricos, frutos rojos o frutas secas. Lcteos Productos lcteos enteros. Bebidas Bebidas que contengan cafena. Alcohol. Alios y condimentos Aderezos o condimentos muy saborizados. Salsa picante. Salsa. Otros alimentos Comidas picantes. Comidas fritas. Alimentos cidos, como encurtidos o alimentos fermentados. Alimentos con alto contenido de International aid/development workerazcar. Alimentos ricos Thrivent Financialen fibra. Es posible que los productos que se enumeran ms arriba no sean una lista completa de los alimentos y las bebidas que se Theatre stage managerdeben evitar. Comunquese con un nutricionista para obtener ms informacin. Resumen  Una dieta suave se compone de alimentos que, en general, son blandos y no tienen [place] grasa, fibra ni condimentos adicionales.  Para el cuerpo, es ms fcil digerir alimentos sin grasa, fibra o condimentos.  Consulte a su mdico para ver cunto tiempo debe seguir este plan de alimentacin. Esta dieta no est destinada a seguirse [employer]durante mucho tiempo. Esta informacin no tiene Theme park managercomo fin reemplazar el  consejo del mdico. Asegrese de hacerle al mdico cualquier pregunta que tenga. Document Released: 12/16/2015 Document Revised: 10/28/2017 Document Reviewed: 10/28/2017 Elsevier Interactive Patient Education  2019 Elsevier Inc.      Edwina BarthMiguel Mujtaba Bollig, MD [facility]

## 2019-02-28 ENCOUNTER — Other Ambulatory Visit: Payer: Self-pay

## 2019-02-28 ENCOUNTER — Ambulatory Visit: Payer: Self-pay | Admitting: Emergency Medicine

## 2019-02-28 ENCOUNTER — Encounter: Payer: Self-pay | Admitting: Emergency Medicine

## 2019-02-28 VITALS — BP 101/65 | HR 62 | Temp 98.4°F | Ht 61.0 in | Wt 138.8 lb

## 2019-02-28 DIAGNOSIS — K279 Peptic ulcer, site unspecified, unspecified as acute or chronic, without hemorrhage or perforation: Secondary | ICD-10-CM

## 2019-02-28 NOTE — Patient Instructions (Signed)
lcera pptica  Peptic Ulcer    Una lcera pptica es una llaga dolorosa en la membrana que recubre el estmago o la primera parte del intestino delgado.  Cules son las causas?  Las causas ms frecuentes de esta afeccin incluyen las siguientes:   Infeccin.   El uso de determinados medicamentos con mucha frecuencia o en gran cantidad.  Qu incrementa el riesgo?  Es ms probable que tenga esta afeccin si:   Fuma.   Tiene antecedentes familiares de lceras.   Bebe alcohol.   Ha sido hospitalizado en una unidad de cuidados intensivos (UCI).  Cules son los signos o los sntomas?  Algunos de los sntomas son los siguientes:   Dolor urente en la zona entre el pecho y el ombligo. El dolor puede tener las siguientes caractersticas:  ? No desaparece (es persistente).  ? Empeora cuando el estmago est vaco.  ? Empeora por la noche.   Acidez estomacal.   Malestar estomacal (nuseas) y vmitos.   Meteorismo.  Si la lcera sangra, puede causar que usted:   Tenga materia fecal (heces) de color negro y de aspecto alquitranado.   Vomite sangre roja brillante.   Vomite materia parecida a la borra de caf.  Cmo se trata?  El tratamiento de esta afeccin puede incluir lo siguiente:   Evitar cosas que pueden causar la lcera, por ejemplo:  ? Fumar.  ? Tomar analgsicos.   Medicamentos para reducir la acidez estomacal.   Antibiticos, si la lcera es causada por una infeccin.   Un procedimiento que se realiza con un tubo pequeo y flexible que tiene una cmara en el extremo (endoscopa alta). Este procedimiento puede hacerse si tiene una lcera sangrante.   Una ciruga. Esta puede ser necesaria en los siguientes casos:  ? Tiene mucho sangrado.  ? La lcera caus una perforacin en algn lugar del aparato digestivo.  Siga estas indicaciones en su casa:   No beba alcohol si el mdico se lo prohbe.   Limite la cantidad de cafena que consume.   No consuma ningn producto que contenga nicotina o tabaco,  como cigarrillos, cigarrillos electrnicos y tabaco de mascar. Si necesita ayuda para dejar de fumar, consulte al mdico.   Tome los medicamentos de venta libre y los recetados solamente como se lo haya indicado el mdico.  ? No cambie ni deje de tomar sus medicamentos a menos que consulte al mdico antes.  ? No tome aspirina, ibuprofeno ni otros antiinflamatorios no esteroideos (AINE) a menos que el mdico se lo haya indicado.   Concurra a todas las visitas de seguimiento como se lo haya indicado el mdico. Esto es importante.  Comunquese con un mdico si:   No mejora luego de 7 das despus de comenzar el tratamiento.   Contina sintiendo malestar en el estmago (indigestin) o acidez estomacal.  Solicite ayuda inmediatamente si:   Tiene dolor de vientre (abdomen) repentino y agudo.   Tiene dolor en el vientre que no desaparece.   La materia fecal (heces) es sanguinolenta o negra, de aspecto alquitranado.   Vomita sangre. Esto tiene un aspecto similar al poso del caf.   Se siente mareado o como si se fuera a desmayar.   Se debilita.   Est muy transpirado o se siente pegajoso o fro al tacto (sudoroso).  Resumen   Los sntomas de una lcera pptica son dolor urente en la zona entre el pecho y el ombligo.   Tome los medicamentos solamente como se lo haya indicado   el mdico.   Limite la cantidad de alcohol y cafena que consume.   Concurra a todas las visitas de seguimiento como se lo haya indicado el mdico.  Esta informacin no tiene como fin reemplazar el consejo del mdico. Asegrese de hacerle al mdico cualquier pregunta que tenga.  Document Released: 09/26/2010 Document Revised: 04/07/2018 Document Reviewed: 04/07/2018  Elsevier Interactive Patient Education  2019 Elsevier Inc.

## 2019-02-28 NOTE — Progress Notes (Signed)
Sarah Andrews 48 y.o.   Chief Complaint  Patient presents with  . Abdominal Pain    1 month     HISTORY OF PRESENT ILLNESS: This is a 48 y.o. female was seen by me at the end of last May for peptic ulcer disease.  Has a history of positive H. pylori.  Treated with PPI and antibiotics.  Feels a lot better.  Has occasional epigastric pain at around 1 in the morning.  No other significant symptoms.  Here for follow-up and also H. pylori test.  Still concerned about the possibility of something else going on.  HPI   Prior to Admission medications   Medication Sig Start Date End Date Taking? Authorizing Provider  clarithromycin (BIAXIN) 500 MG tablet Take 1 tablet (500 mg total) by mouth 2 (two) times daily. Patient not taking: Reported on 02/28/2019 01/23/19   Sarah Andrews, Sarah Breeze Jose, MD  erythromycin ophthalmic ointment Place a 1/2 inch ribbon of ointment into the upper eyelid. Patient not taking: Reported on 01/23/2019 08/12/18   Sarah Andrews, [name], MD  hydrOXYzine (ATARAX/VISTARIL) 25 MG tablet Take 1-2 tablets (25-50 mg total) by mouth at bedtime as needed. Patient not taking: Reported on 01/23/2019 02/05/18   Sarah Andrews, [name], PA-C  omeprazole (PRILOSEC) 40 MG capsule Take 1 capsule (40 mg total) by mouth daily for 14 days. 01/23/19 02/06/19  Sarah Andrews, Sarah Burgher Jose, MD  ranitidine (ZANTAC) 300 MG tablet Take 1 tablet (300 mg total) by mouth at bedtime for 14 days. 04/16/18 04/30/18  Sarah Andrews, Sarah Pho Jose, MD    Allergies  Allergen Reactions  . Prednisone     Dizziness, dry mouth and "throat closing, but not swelling"    Patient Active Problem List   Diagnosis Date Noted  . Chronic right-sided thoracic back pain 07/04/2017  . Arthritis   . Low grade squamous intraepithelial lesion (LGSIL) on cervical Pap smear 02/10/2016  . Dysfunctional uterine bleeding 04/08/2011    Past Medical History:  Diagnosis Date  . Arthritis    Mild OA changes T6-T7  . Dysmenorrhea   . Migraines      No past surgical history on file.  Social History   Socioeconomic History  . Marital status: Single    Spouse name: Not on file  . Number of children: 1  . Years of education: 2 years college-accounting  . Highest education level: Not on file  Occupational History  . Occupation: LexicographerCleans homes  Social Needs  . Financial resource strain: Not on file  . Food insecurity    Worry: Not on file    Inability: Not on file  . Transportation needs    Medical: Not on file    Non-medical: Not on file  Tobacco Use  . Smoking status: Former Smoker    Years: 1.00    Quit date: 04/07/2013    Years since quitting: 5.8  . Smokeless tobacco: Never Used  Substance and Sexual Activity  . Alcohol use: Yes  . Drug use: No  . Sexual activity: Not Currently    Birth control/protection: None    Comment: wants to become pregnant  Lifestyle  . Physical activity    Days per week: Not on file    Minutes per session: Not on file  . Stress: Not on file  Relationships  . Social Musicianconnections    Talks on phone: Not on file    Gets together: Not on file    Attends religious service: Not on file    Active member of club  or organization: Not on file    Attends meetings of clubs or organizations: Not on file    Relationship status: Not on file  . Intimate partner violence    Fear of current or ex partner: Not on file    Emotionally abused: Not on file    Physically abused: Not on file    Forced sexual activity: Not on file  Other Topics Concern  . Not on file  Social History Narrative   Originally from [place]   [place] in [place]   Lives with her 46 yo son.   He goes to [place]        Family History  Problem Relation Age of Onset  . Kidney disease Mother        dialysis, she is not certain of cause  . Hypotension Mother   . Diabetes Father   . Diabetes Sister   . Kidney disease Brother      Review of Systems  Constitutional: Negative.  Negative for chills and fever.  HENT:  Negative.   Respiratory: Negative.  Negative for cough and shortness of breath.   Cardiovascular: Negative.  Negative for chest pain and palpitations.  Gastrointestinal: Positive for abdominal pain and heartburn. Negative for blood in stool, diarrhea, nausea and vomiting.  Musculoskeletal: Negative.   Skin: Negative.  Negative for rash.  Neurological: Negative.  Negative for dizziness and headaches.  All other systems reviewed and are negative.  Vitals:   02/28/19 0855 02/28/19 0923  BP: 101/65   Pulse: 62   Temp: (!) 97.3 F (36.3 C) 98.4 F (36.9 C)  SpO2: 98%      Physical Exam Vitals signs reviewed.  Constitutional:      Appearance: Normal appearance. She is well-developed.  HENT:     Head: Normocephalic and atraumatic.  Eyes:     Extraocular Movements: Extraocular movements intact.     Pupils: Pupils are equal, round, and reactive to light.  Neck:     Musculoskeletal: Normal range of motion and neck supple.  Cardiovascular:     Rate and Rhythm: Normal rate and regular rhythm.     Heart sounds: Normal heart sounds.  Pulmonary:     Effort: Pulmonary effort is normal.     Breath sounds: Normal breath sounds.  Abdominal:     General: Bowel sounds are normal. There is no distension.     Palpations: Abdomen is soft.     Tenderness: There is no abdominal tenderness.  Musculoskeletal: Normal range of motion.  Skin:    General: Skin is warm and dry.     Capillary Refill: Capillary refill takes less than 2 seconds.  Neurological:     General: No focal deficit present.     Mental Status: She is alert and oriented to person, place, and time.  Psychiatric:        Mood and Affect: Mood normal.        Behavior: Behavior normal.      ASSESSMENT & PLAN: Doristine CounterBertha was seen today for abdominal pain.  Diagnoses and all orders for this visit:  Peptic ulcer disease -     H. pylori breath test -     Ambulatory referral to Gastroenterology    Patient Instructions  lcera  pptica Peptic Ulcer  Neomia DearUna lcera pptica es una llaga dolorosa en la membrana que recubre el estmago o la primera parte del intestino delgado. Cules son las causas? Las causas ms frecuentes de esta afeccin incluyen las siguientes:  Infeccin.  El uso de determinados medicamentos con mucha frecuencia o en gran cantidad. Qu incrementa el riesgo? Es ms probable que tenga esta afeccin si:  Fuma.  Tiene antecedentes familiares de lceras.  Bebe alcohol.  Ha sido hospitalizado en una unidad de cuidados intensivos [place]). Cules son los signos o los sntomas? Algunos de los sntomas son los siguientes:  Dolor urente en la zona entre el pecho y el ombligo. El dolor puede tener las siguientes caractersticas: ? No desaparece (es persistente). ? Empeora cuando el estmago est vaco. ? Empeora por la noche.  Acidez estomacal.  Malestar estomacal (nuseas) y vmitos.  Meteorismo. Si la lcera sangra, puede causar que usted:  Tenga materia fecal (heces) de color negro y de aspecto alquitranado.  Vomite sangre roja brillante.  Vomite materia parecida a la borra de caf. Cmo se trata? El tratamiento de esta afeccin puede incluir lo siguiente:  Product/process development scientist cosas que pueden causar la lcera, por ejemplo: ? Fumar. ? Tomar analgsicos.  Medicamentos para reducir la Merchant navy officer.  Antibiticos, si la lcera es causada por una infeccin.  Un procedimiento que se realiza con un tubo pequeo y flexible que tiene una cmara en el extremo (endoscopa alta). Este procedimiento puede hacerse si tiene una lcera sangrante.  Clementeen Hoof. Esta puede ser necesaria en los siguientes casos: ? Tiene mucho sangrado. ? La lcera caus una perforacin en algn lugar del aparato digestivo. Siga estas indicaciones en su casa:  No beba alcohol si el mdico se lo prohbe.  Limite la cantidad de cafena que consume.  No consuma ningn producto que contenga nicotina o tabaco, como  cigarrillos, cigarrillos electrnicos y tabaco de Higher education careers adviser. Si necesita ayuda para dejar de fumar, consulte al [employer].  [employer] de venta libre y los recetados solamente como se lo haya indicado el mdico. ? No cambie ni deje de tomar sus medicamentos a menos que consulte al mdico antes. ? No tome aspirina, ibuprofeno ni otros antiinflamatorios no esteroideos (AINE) a menos que el mdico se lo haya indicado.  Concurra a todas las visitas de seguimiento como se lo haya indicado el mdico. Esto es importante. Comunquese con un mdico si:  No mejora luego de 7 das despus de [employer].  Contina sintiendo Engineer, site (indigestin) o acidez estomacal. Solicite ayuda inmediatamente si:  Tiene dolor de vientre (abdomen) repentino y agudo.  Tiene dolor en el vientre que no desaparece.  La materia fecal (heces) es sanguinolenta o negra, de aspecto alquitranado.  Vomita sangre. Esto tiene un aspecto similar al poso del caf.  Se siente mareado o como si se fuera a desmayar.  Se debilita.  Est muy transpirado o se siente pegajoso o fro al tacto (sudoroso). Resumen  Los sntomas de una lcera pptica son dolor urente en la zona entre el pecho y el ombligo.  Tome los medicamentos solamente como se lo haya indicado el mdico.  Limite la cantidad de alcohol y cafena que consume.  Concurra a todas las visitas de seguimiento como se lo haya indicado el mdico. Esta informacin no tiene Marine scientist el consejo del mdico. Asegrese de hacerle al mdico cualquier pregunta que tenga. Document Released: 09/26/2010 Document Revised: 04/07/2018 Document Reviewed: 04/07/2018 Elsevier Interactive Patient Education  2019 Elsevier Inc.      Agustina Caroli, MD [facility]

## 2019-03-02 ENCOUNTER — Telehealth: Payer: Self-pay | Admitting: Emergency Medicine

## 2019-03-02 LAB — H. PYLORI BREATH TEST: H pylori Breath Test: NEGATIVE

## 2019-03-02 LAB — H. PYLORI BREATH COLLECTION

## 2019-03-02 NOTE — Telephone Encounter (Signed)
Spoke to patient about H. pylori results.

## 2019-04-07 ENCOUNTER — Ambulatory Visit: Payer: No Typology Code available for payment source | Admitting: Family Medicine

## 2019-04-28 ENCOUNTER — Ambulatory Visit: Payer: No Typology Code available for payment source | Admitting: Family Medicine

## 2019-05-30 ENCOUNTER — Encounter: Payer: Self-pay | Admitting: Nurse Practitioner

## 2019-05-30 ENCOUNTER — Ambulatory Visit: Payer: Self-pay | Attending: Family Medicine | Admitting: Nurse Practitioner

## 2019-05-30 DIAGNOSIS — K219 Gastro-esophageal reflux disease without esophagitis: Secondary | ICD-10-CM

## 2019-05-30 DIAGNOSIS — R1013 Epigastric pain: Secondary | ICD-10-CM

## 2019-05-30 DIAGNOSIS — Z13220 Encounter for screening for lipoid disorders: Secondary | ICD-10-CM

## 2019-05-30 DIAGNOSIS — Z130 Encounter for screening for diseases of the blood and blood-forming organs and certain disorders involving the immune mechanism: Secondary | ICD-10-CM

## 2019-05-30 DIAGNOSIS — K279 Peptic ulcer, site unspecified, unspecified as acute or chronic, without hemorrhage or perforation: Secondary | ICD-10-CM

## 2019-05-30 MED ORDER — OMEPRAZOLE 40 MG PO CPDR: 40.0000 mg | DELAYED_RELEASE_CAPSULE | Freq: Every day | ORAL | 1 refills | Status: DC

## 2019-05-30 MED ORDER — RANITIDINE HCL 300 MG PO TABS: 300.0000 mg | ORAL_TABLET | Freq: Every day | ORAL | 1 refills | Status: DC

## 2019-05-30 MED FILL — OMEPRAZOLE DR 40 MG CAPSULE: 40 | 30 days supply | Qty: 30 | Fill #0

## 2019-05-30 NOTE — Progress Notes (Signed)
Virtual Visit via Telephone Note Due to national recommendations of social distancing due to [place] 19, telehealth visit is felt to be most appropriate for this patient at this time.  I discussed the limitations, risks, security and privacy concerns of performing an evaluation and management service by telephone and the availability of in person appointments. I also discussed with the patient that there may be a patient responsible charge related to this service. The patient expressed understanding and agreed to proceed.    I connected with Sarah Andrews on 05/30/19  at   1:50 PM EDT  EDT by telephone and verified that I am speaking with the correct person using two identifiers.   Consent I discussed the limitations, risks, security and privacy concerns of performing an evaluation and management service by telephone and the availability of in person appointments. I also discussed with the patient that there may be a patient responsible charge related to this service. The patient expressed understanding and agreed to proceed.   Location of Patient: Private Residence   Location of Provider: [place] and [employer] Office    Persons participating in Telemedicine visit: [name] FNP-BC [place]  Spanish Interpreter [place]  ID# [id number]   History of Present Illness: Telemedicine visit for: Establish Care  has a past medical history of Arthritis, Dysmenorrhea, GERD (gastroesophageal reflux disease), H. pylori infection, and Migraines.   GERD: Patient complains of heartburn and burning in her stomach. This has been associated with chest pain, heartburn, midespigastric pain, shortness of breath and upper abdominal discomfort.  She denies choking on food, cough, deep pressure at base of neck and difficulty swallowing. Symptoms have been present for a few years. She denies dysphagia.  She has not lost weight. She denies melena, hematochezia,  hematemesis, and coffee ground emesis. Medical therapy in the past has included H2 antagonists and proton pump inhibitors.  She has history of h. Pylori in 2019  which was treated with PPI and abx. Her repeat testing was negative after treatment.  Today she states she stopped taking omeprazole as she was told by her previous provider that she could not take it for a long period of time. She does state that her symptoms have worsened since she stopped taking it.  Will restart today and have her follow up in a few weeks.    Past Medical History:  Diagnosis Date  . Arthritis    Mild OA changes T6-T7  . Dysmenorrhea   . GERD (gastroesophageal reflux disease)   . H. pylori infection   . Migraines     Past Surgical History:  Procedure Laterality Date  . NO PAST SURGERIES      Family History  Problem Relation Age of Onset  . Kidney disease Mother        dialysis, she is not certain of cause  . Hypotension Mother   . Diabetes Father   . Diabetes Sister   . Kidney disease Brother     Social History   Socioeconomic History  . Marital status: Single    Spouse name: Not on file  . Number of children: 1  . Years of education: 2 years college-accounting  . Highest education level: Not on file  Occupational History  . Occupation: Microbiologist homes  Social Needs  . Financial resource strain: Not on file  . Food insecurity    Worry: Not on file    Inability: Not on file  . Transportation needs    Medical: Not  on file    Non-medical: Not on file  Tobacco Use  . Smoking status: Former Smoker    Years: 1.00    Quit date: 04/07/2013    Years since quitting: 6.1  . Smokeless tobacco: Never Used  Substance and Sexual Activity  . Alcohol use: Yes  . Drug use: No  . Sexual activity: Yes    Birth control/protection: None    Comment: wants to become pregnant  Lifestyle  . Physical activity    Days per week: Not on file    Minutes per session: Not on file  . Stress: Not on file   Relationships  . Social Musician on phone: Not on file    Gets together: Not on file    Attends religious service: Not on file    Active member of club or organization: Not on file    Attends meetings of clubs or organizations: Not on file    Relationship status: Not on file  Other Topics Concern  . Not on file  Social History Narrative   Originally from [place]   Came to [place] in 2002   Lives with her 71 yo son.   He goes to [place]         Observations/Objective: Awake, alert and oriented x 3   Review of Systems  Constitutional: Negative for fever, malaise/fatigue and weight loss.  HENT: Negative.  Negative for nosebleeds.   Eyes: Negative.  Negative for blurred vision, double vision and photophobia.  Respiratory: Negative.  Negative for cough and shortness of breath.   Cardiovascular: Negative.  Negative for chest pain, palpitations and leg swelling.  Gastrointestinal: Negative.  Negative for heartburn, nausea and vomiting.  Musculoskeletal: Positive for joint pain (OA). Negative for myalgias.  Neurological: Positive for headaches (chronic). Negative for dizziness, focal weakness and seizures.  Psychiatric/Behavioral: Negative.  Negative for suicidal ideas.    Assessment and Plan Diagnoses and all orders for this visit:  GERD without esophagitis -     omeprazole (PRILOSEC) 40 MG capsule; Take 1 capsule (40 mg total) by mouth daily. -     ranitidine (ZANTAC) 300 MG tablet; Take 1 tablet (300 mg total) by mouth at bedtime for 14 days. INSTRUCTIONS: Avoid GERD Triggers: acidic, spicy or fried foods, caffeine, coffee, sodas,  alcohol and chocolate.    Epigastric pain -     omeprazole (PRILOSEC) 40 MG capsule; Take 1 capsule (40 mg total) by mouth daily. -     ranitidine (ZANTAC) 300 MG tablet; Take 1 tablet (300 mg total) by mouth at bedtime for 14 days.  Peptic ulcer disease -     omeprazole (PRILOSEC) 40 MG capsule; Take 1 capsule (40 mg total) by  mouth daily. -     ranitidine (ZANTAC) 300 MG tablet; Take 1 tablet (300 mg total) by mouth at bedtime for 14 days.     Follow Up Instructions Return in about 6 weeks (around 07/11/2019).     I discussed the assessment and treatment plan with the patient. The patient was provided an opportunity to ask questions and all were answered. The patient agreed with the plan and demonstrated an understanding of the instructions.   The patient was advised to call back or seek an in-person evaluation if the symptoms worsen or if the condition fails to improve as anticipated.  I provided 24 minutes of non-face-to-face time during this encounter including median intraservice time, reviewing previous notes, labs, imaging, medications and explaining diagnosis and  management.  Gildardo Pounds, FNP-BC

## 2019-07-11 ENCOUNTER — Encounter: Payer: Self-pay | Admitting: Nurse Practitioner

## 2019-07-11 ENCOUNTER — Ambulatory Visit: Payer: Self-pay | Attending: Nurse Practitioner | Admitting: Nurse Practitioner

## 2019-07-11 ENCOUNTER — Other Ambulatory Visit: Payer: Self-pay

## 2019-07-11 DIAGNOSIS — K279 Peptic ulcer, site unspecified, unspecified as acute or chronic, without hemorrhage or perforation: Secondary | ICD-10-CM

## 2019-07-11 DIAGNOSIS — R1013 Epigastric pain: Secondary | ICD-10-CM

## 2019-07-11 DIAGNOSIS — K219 Gastro-esophageal reflux disease without esophagitis: Secondary | ICD-10-CM

## 2019-07-11 MED ORDER — RANITIDINE HCL 300 MG PO TABS: 300.0000 mg | ORAL_TABLET | Freq: Every day | ORAL | 1 refills | Status: DC

## 2019-07-11 MED ORDER — PANTOPRAZOLE SODIUM 40 MG PO TBEC: 40.0000 mg | DELAYED_RELEASE_TABLET | Freq: Every day | ORAL | 3 refills | Status: DC

## 2019-07-11 MED FILL — PANTOPRAZOLE SOD DR 40 MG T: 40 | 30 days supply | Qty: 30 | Fill #0

## 2019-07-11 NOTE — Patient Instructions (Signed)
Opciones de alimentos para pacientes adultos con enfermedad de reflujo gastroesofgico Food Choices for Gastroesophageal Reflux Disease, Adult Si tiene enfermedad de reflujo gastroesofgico (ERGE), los alimentos que consume y los hbitos de alimentacin son muy importantes. Elegir los alimentos adecuados puede ayudar a [employer]. Piense en consultar a un especialista en nutricin (nutricionista) para que lo ayude a Associate Professor. Consejos para seguir [employer] plan  Comidas  Elija alimentos saludables con bajo contenido de grasa, como frutas, verduras, cereales integrales, productos lcteos descremados y carne [place] de [place], de pescado y de [place].  Haga comidas pequeas durante Glass blower/designer de 3 comidas abundantes. Coma lentamente y en un lugar donde est distendido. Evite agacharse o recostarse hasta 2 o 3horas despus de haber comido.  Evite comer 2 a 3horas antes de ir a acostarse.  Evite beber grandes cantidades de lquidos con las comidas.  Evite frer los alimentos a la hora de la coccin. Puede hornear, grillar o asar a la parrilla.  Evite o limite la cantidad de: ? Chocolate. ? Menta y mentol. ? Alcohol. ? Pimienta. ? Caf negro y descafeinado. ? T negro y descafeinado. ? Bebidas con gas (gaseosas). ? Bebidas energizantes y refrescos que contengan cafena.  Limite los alimentos con alto contenido de [place], por ejemplo: ? Carnes grasas o alimentos fritos. ? Leche entera, crema, manteca o helado. ? Nueces y [place] de frutos secos. ? Pastelera, donas y dulces hechos con [place] o [place].  Evite los alimentos que le ocasionen sntomas. Estos pueden ser distintos para Advertising account planner. Los alimentos que suelen causan sntomas son los siguientes: ? Haematologist. ? Naranjas, limones y limas. ? Pimientos. ? Comidas condimentadas. ? Cebolla y Kae Heller. ? Vinagre. Estilo de vida  Mantenga un peso saludable. Pregntele a su mdico cul es el peso saludable  para usted. Si necesita perder peso, hable con su mdico para hacerlo de manera segura.  Realice actividad fsica durante, al menos, 30 minutos 5 das por semana o ms, o segn lo indicado por su mdico.  Use ropa suelta.  No fume. Si necesita ayuda para dejar de fumar, consulte al mdico.  Duerma con la cabecera de la cama ms elevada que los pies. Use una cua debajo del colchn o bloques debajo del armazn de la cama para Pharmacologist la cabecera de la cama elevada. Resumen  Si tiene enfermedad de reflujo gastroesofgico (ERGE), las elecciones de alimentos y el [place] de vida son muy importantes para ayudar a Paramedic los sntomas.  Haga comidas pequeas durante Glass blower/designer de 3 comidas abundantes. Coma lentamente y en un lugar donde est distendido.  Limite los alimentos con alto contenido graso como la carne grasa o los alimentos fritos.  Evite agacharse o recostarse hasta 2 o 3horas despus de haber comido.  Evite la menta y [place] buena, la cafena, el alcohol y el chocolate. Esta informacin no tiene Theme park manager el consejo del mdico. Asegrese de hacerle al mdico cualquier pregunta que tenga. Document Released: 02/23/2012 Document Revised: 03/30/2017 Document Reviewed: 03/30/2017 Elsevier Patient Education  2020 Elsevier Inc.  Enfermedad de reflujo gastroesofgico en los adultos Gastroesophageal Reflux Disease, Adult El reflujo gastroesofgico (RGE) ocurre cuando el cido del estmago sube por el tubo que conecta la boca con el estmago (esfago). Normalmente, la comida baja por el esfago y se mantiene en el [address], donde se la digiere. Cuando una persona tiene RGE, los alimentos y el cido estomacal suelen volver al esfago. Usted puede [employer]  enfermedad llamada enfermedad de reflujo gastroesofgico (ERGE) si el reflujo:  Sucede a menudo.  Causa sntomas frecuentes o muy intensos.  Causa problemas tales como dao en el esfago. Cuando esto ocurre, el esfago  duele y se hincha (inflama). Con el tiempo, la ERGE puede ocasionar pequeos agujeros (lceras) en el revestimiento del esfago. Cules son las causas? Esta afeccin se debe a un problema en el msculo que se encuentra entre el esfago y Madisonel estmago. Cuando este msculo est dbil o no es normal, no se cierra correctamente para impedir que los alimentos y el cido regresen del Teaching laboratory technicianestmago. El msculo puede debilitarse debido a lo siguiente:  El consumo de [place] Tina Ranchtabaco.  [place].  Tener cierto tipo de hernia (hernia de hiato).  Consumo de alcohol.  Ciertos alimentos y bebidas, como caf, chocolate, cebollas y Urbannamenta. Qu incrementa el riesgo? Es ms probable que tenga esta afeccin si:  Tiene sobrepeso.  Tiene una enfermedad que afecta el tejido conjuntivo.  [place] antiinflamatorios no esteroideos (AINE). Cules son los signos o los sntomas? Los sntomas de esta afeccin incluyen:  Acidez estomacal.  Dificultad o dolor al tragar.  Sensacin de Warehouse managertener un bulto en la garganta.  Sabor amargo en la boca.  Mal aliento.  Tener una gran cantidad de saliva.  Estmago inflamado o con Dentistmalestar.  Eructos.  Dolor en el pecho. El dolor de pecho puede deberse a distintas afecciones. Asegrese de Science writerconsultar a su mdico si tiene Journalist, newspaperdolor en el pecho.  Falta de aire o respiracin ruidosa (sibilancias).  Tos constante (crnica) o durante la noche.  Desgaste de la superficie de los dientes (esmalte dental).  Prdida de peso. Cmo se trata? El tratamiento depender de la gravedad de los sntomas. El mdico puede sugerirle lo siguiente:  Cambios en la dieta.  Medicamentos.  Cipriano MileUna ciruga. Siga estas indicaciones en su casa: Comida y bebida   Siga una dieta como se lo haya indicado el mdico. Es posible que deba evitar alimentos y bebidas, por ejemplo: ? Caf y t (con o sin cafena). ? Bebidas que contengan alcohol. ? Bebidas energticas y deportivas. ? Bebidas gaseosas y refrescos. ?  Chocolate y cacao. ? Menta y esencia de [place]. ? Ajo y cebolla. ? Rbano picante. ? Alimentos cidos y condimentados. Estos incluyen todos los tipos de pimientos, [place] en polvo, curry en polvo, vinagre, salsas picantes y Occidental Petroleumsalsa barbacoa. ? Ctricos y sus jugos, por ejemplo, naranjas, limones y limas. ? Alimentos que CSX Corporationcontengan tomate. Estos incluyen salsa roja, [place], salsa picante y pizza con salsa de Locklandtomate. ? Alimentos fritos y Lexicographergrasos. Estos incluyen donas, papas fritas, papitas fritas de bolsa y aderezos con alto contenido de [place] deg [place]grasa. ? Carnes con alto contenido de [place] 60 deg [place]grasa. Estas incluye los perros calientes, chuletas o costillas, embutidos, jamn y tocino. ? Productos lcteos ricos en grasas, como leche [place]entera, Cro[place]udsonmanteca y Commercequeso crema.  Consuma pequeas cantidades de comida con ms frecuencia. Evite consumir porciones abundantes.  Evite beber grandes cantidades de lquidos con las comidas.  Evite comer 2 o 3horas antes de acostarse.  Evite recostarse inmediatamente despus de comer.  No haga ejercicios enseguida despus de comer. Estilo de vida   No consuma ningn producto que contenga nicotina o tabaco. Estos incluyen cigarrillos, cigarrillos electrnicos y tabaco para Theatre managermascar. Si necesita ayuda para dejar de fumar, consulte al American Expressmdico.  Intente reducir J. C. Penneyel nivel de estrs. Si necesita ayuda para hacer esto, consulte al mdico.  Si tiene sobrepeso, baje una cantidad de peso saludable para usted. Consulte a su  mdico para bajar de peso de [place] segura. Indicaciones generales  Est atento a cualquier cambio en los sntomas.  Tome los medicamentos de venta libre y los recetados solamente como se lo haya indicado el mdico. No tome aspirina, ibuprofeno ni otros AINE a menos que el mdico lo autorice.  Use ropa holgada. No use nada apretado alrededor de la cintura.  Levante (eleve) la cabecera de la cama aproximadamente 6pulgadas (15cm).  Evite inclinarse si al hacerlo empeoran los  sntomas.  Concurra a todas las visitas de [address] se lo haya indicado el mdico. Esto es importante. Comunquese con un mdico si:  Aparecen nuevos sntomas.  Adelgaza y no sabe por qu.  Tiene problemas para tragar o le duele cuando traga.  Tiene sibilancias o tos persistente.  Los sntomas no mejoran con Scientist, research (medical).  Tiene la voz ronca. Solicite ayuda inmediatamente si:  Goldman Sachs, el cuello, la [place], los dientes o la espalda.  Se siente transpirado, mareado o tiene una sensacin de desvanecimiento.  Siente falta de aire o Journalist, newspaper.  Vomita y el vmito tiene un aspecto similar a la sangre o a los posos de caf.  Pierde el conocimiento (se desmaya).  Las deposiciones (heces) son sanguinolentas o negras.  No puede tragar, beber o comer. Resumen  Si una persona tiene enfermedad de reflujo gastroesofgico (ERGE), los alimentos y el cido estomacal suben al esfago y causan sntomas o problemas tales como dao en el esfago.  El tratamiento depender de la gravedad de los sntomas.  Siga una Air traffic controller se lo haya indicado el mdico.  Tome todos los medicamentos solamente como se lo haya indicado el mdico. Esta informacin no tiene Theme park manager el consejo del mdico. Asegrese de hacerle al mdico cualquier pregunta que tenga. Document Released: 09/26/2010 Document Revised: 04/07/2018 Document Reviewed: 04/07/2018 Elsevier Patient Education  2020 Elsevier Inc.  Acidez estomacal Heartburn La acidez estomacal es un tipo de dolor o Dentist que puede sentirse en la garganta o en el pecho. Con frecuencia se describe como un dolor urente (ardor). Tambin puede causar mal sabor en la boca que se siente cido. La acidez estomacal puede empeorar al acostarse o al inclinarse. Puede ser peor por la noche. Puede ser ocasionada porque el contenido del estmago vuelve hacia arriba (reflujo) por el tubo que conecta la boca con el  estmago (esfago). Siga estas indicaciones en su casa: Comida y bebida   Evite determinados alimentos y bebidas como se lo haya indicado el mdico. Estos pueden incluir: ? Caf y t (con o sin cafena). ? Bebidas que contengan alcohol. ? Bebidas energticas y deportivas. ? Bebidas gaseosas o refrescos. ? Chocolate y cacao. ? Menta y esencia de [place]. ? Ajo y cebolla. ? Rbano picante. ? Alimentos cidos y condimentados, tales como:  Pimientos.  [place] en polvo y curry en polvo.  Vinagre.  Salsas picantes y Occidental Petroleum. ? Los ctricos y sus jugos, tales como:  [place].  Limones.  Limas. ? Alimentos a base de tomate, tales como:  Salsa roja y pizza con salsa roja.  [place].  Salsa. ? Alimentos fritos y Scientist, physiological, tales como:  [place].  Papas fritas y papas fritas de bolsa.  Aderezos con alto contenido de Holiday representative. ? Carnes con alto contenido de [place], tales como:  Perros calientes y Primary school teacher.  Chuletas.  Jamn y tocino. ? Productos lcteos con alto contenido de [place], tales como:  Leche entera.  [place]ant[place].  Queso crema.  Consuma pequeas cantidades de comida con ms frecuencia. Evite consumir porciones abundantes.  Evite beber grandes cantidades de lquidos con las comidas.  Evite comer 2 o 3horas antes de acostarse.  Evite recostarse inmediatamente despus de comer.  No haga ejercicios enseguida despus de comer. Estilo de vida      Si tiene sobrepeso, baje una cantidad de peso saludable para usted. Consulte a su mdico para bajar de peso de [employer].  No consuma ningn producto que contenga nicotina o tabaco, lo que incluye cigarrillos, cigarrillos electrnicos y tabaco de Theatre manager. Estos pueden empeorar los sntomas. Si necesita ayuda para dejar de fumar, consulte al mdico.  Use ropa holgada. No use nada apretado alrededor de la cintura.  Levante (eleve) la cabecera de la cama aproximadamente 6pulgadas (15cm) para dormir.   Intente reducir J. C. Penney de estrs. Si necesita ayuda para hacer esto, consulte al mdico. Indicaciones generales  Est atento a cualquier cambio en los sntomas.  Tome los medicamentos de venta libre y los recetados solamente como se lo haya indicado el mdico. ? No tome aspirina, ibuprofeno ni otros antiinflamatorios no esteroideos (AINE) a menos que el mdico lo autorice. ? Deje de tomar medicamentos solamente como se lo haya indicado el mdico.  Concurra a todas las visitas de seguimiento como se lo haya indicado el mdico. Esto es importante. Comunquese con un mdico si:  Aparecen nuevos sntomas.  Adelgaza y no sabe por qu est sucediendo esto.  Tiene problemas para tragar, o le duele cuando traga.  Tiene sibilancia o tos persistente.  Los sntomas no mejoran con Scientist, research (medical).  Tiene acidez estomacal con frecuencia durante ms de 2semanas. Solicite ayuda inmediatamente si:  Goldman Sachs, el cuello, la [place], los dientes o la espalda.  Se siente transpirado, mareado o tiene una sensacin de desvanecimiento.  Siente falta de aire o Journalist, newspaper.  Vomita, y el vmito tiene un aspecto similar a la sangre o a los posos de caf.  Las deposiciones (heces) son sanguinolentas o negras. Estos sntomas pueden representar un problema grave que constituye Radio broadcast assistant. No espere a ver si los sntomas desaparecen. Solicite atencin mdica de inmediato. Comunquese con el servicio de emergencias de su localidad (911 en los [place]). No conduzca por sus propios medios [employer] [facility] hospital. Resumen  La acidez estomacal es un tipo de dolor que puede sentirse en la garganta o en el pecho. Puede sentirse como un dolor urente (ardor). Tambin puede causar mal sabor en la boca que se siente cido.  Es posible que deba evitar ciertos alimentos y bebidas para ayudar a Paramedic los sntomas. Pregntele al mdico qu alimentos y bebidas Personnel officer.  Tome los  medicamentos de venta libre y los recetados solamente como se lo haya indicado el mdico. No tome aspirina, ibuprofeno ni otros antiinflamatorios no esteroideos (AINE) a menos que el mdico se lo haya indicado.  Comunquese con el mdico si los sntomas no mejoran o si empeoran. Esta informacin no tiene Theme park manager el consejo del mdico. Asegrese de hacerle al mdico cualquier pregunta que tenga. Document Released: 05/06/2011 Document Revised: 03/03/2018 Document Reviewed: 03/03/2018 Elsevier Patient Education  2020 Elsevier Inc.  lcera pptica Peptic Ulcer  [place] pptica es una llaga dolorosa en la membrana que recubre el estmago o la primera parte del intestino delgado. Cules son las causas? Las causas ms frecuentes de esta afeccin incluyen las siguientes:  Infeccin.  El uso de determinados medicamentos con mucha frecuencia  o en gran cantidad. Qu incrementa el riesgo? Es ms probable que tenga esta afeccin si:  Fuma.  Tiene antecedentes familiares de lceras.  Bebe alcohol.  Ha sido hospitalizado en una unidad de cuidados intensivos [place]). Cules son los signos o los sntomas? Algunos de los sntomas son los siguientes:  Dolor urente en la zona entre el pecho y el ombligo. El dolor puede tener las siguientes caractersticas: ? No desaparece (es persistente). ? Empeora cuando el estmago est vaco. ? Empeora por la noche.  Acidez estomacal.  Malestar estomacal (nuseas) y vmitos.  Meteorismo. Si la lcera sangra, puede causar que usted:  Tenga materia fecal (heces) de color negro y de aspecto alquitranado.  Vomite sangre roja brillante.  Vomite materia parecida a la borra de caf. Cmo se trata? El tratamiento de esta afeccin puede incluir lo siguiente:  Product/process development scientist cosas que pueden causar la lcera, por ejemplo: ? Fumar. ? Tomar analgsicos.  Medicamentos para reducir la Merchant navy officer.  Antibiticos, si la lcera es causada por  una infeccin.  Un procedimiento que se realiza con un tubo pequeo y flexible que tiene una cmara en el extremo (endoscopa alta). Este procedimiento puede hacerse si tiene una lcera sangrante.  Clementeen Hoof. Esta puede ser necesaria en los siguientes casos: ? Tiene mucho sangrado. ? La lcera caus una perforacin en algn lugar del aparato digestivo. Siga estas indicaciones en su casa:  No beba alcohol si el mdico se lo prohbe.  Limite la cantidad de cafena que consume.  No consuma ningn producto que contenga nicotina o tabaco, como cigarrillos, cigarrillos electrnicos y tabaco de Higher education careers adviser. Si necesita ayuda para dejar de fumar, consulte al [employer].  [employer] de venta libre y los recetados solamente como se lo haya indicado el mdico. ? No cambie ni deje de tomar sus medicamentos a menos que consulte al mdico antes. ? No tome aspirina, ibuprofeno ni otros antiinflamatorios no esteroideos (AINE) a menos que el mdico se lo haya indicado.  Concurra a todas las visitas de seguimiento como se lo haya indicado el mdico. Esto es importante. Comunquese con un mdico si:  No mejora luego de 7 das despus de [employer].  Contina sintiendo Engineer, site (indigestin) o acidez estomacal. Solicite ayuda inmediatamente si:  Tiene dolor de vientre (abdomen) repentino y agudo.  Tiene dolor en el vientre que no desaparece.  La materia fecal (heces) es sanguinolenta o negra, de aspecto alquitranado.  Vomita sangre. Esto tiene un aspecto similar al poso del caf.  Se siente mareado o como si se fuera a desmayar.  Se debilita.  Est muy transpirado o se siente pegajoso o fro al tacto (sudoroso). Resumen  Los sntomas de una lcera pptica son dolor urente en la zona entre el pecho y el ombligo.  Tome los medicamentos solamente como se lo haya indicado el mdico.  Limite la cantidad de alcohol y cafena que consume.  Concurra a todas las  visitas de seguimiento como se lo haya indicado el mdico. Esta informacin no tiene Marine scientist el consejo del mdico. Asegrese de hacerle al mdico cualquier pregunta que tenga. Document Released: 09/26/2010 Document Revised: 04/07/2018 Document Reviewed: 04/07/2018 Elsevier Patient Education  2020 [employer].

## 2019-07-11 NOTE — Progress Notes (Signed)
Assessment & Plan:  [name] was seen today for follow-up.  Diagnoses and all orders for this visit:  GERD without esophagitis -     ranitidine (ZANTAC) 300 MG tablet; Take 1 tablet (300 mg total) by mouth at bedtime. -     pantoprazole (PROTONIX) 40 MG tablet; Take 1 tablet (40 mg total) by mouth daily. INSTRUCTIONS: Avoid GERD Triggers: acidic, spicy or fried foods, caffeine, coffee, sodas,  alcohol and chocolate.    Epigastric pain -     ranitidine (ZANTAC) 300 MG tablet; Take 1 tablet (300 mg total) by mouth at bedtime. -     pantoprazole (PROTONIX) 40 MG tablet; Take 1 tablet (40 mg total) by mouth daily.  Peptic ulcer disease -     ranitidine (ZANTAC) 300 MG tablet; Take 1 tablet (300 mg total) by mouth at bedtime. -     pantoprazole (PROTONIX) 40 MG tablet; Take 1 tablet (40 mg total) by mouth daily.    Patient has been counseled on age-appropriate routine health concerns for screening and prevention. These are reviewed and up-to-date. Referrals have been placed accordingly. Immunizations are up-to-date or declined.    Subjective:   Chief Complaint  Patient presents with   Follow-up    6 week   HPI Sarah Andrews 48 y.o. female presents to office today for follow up to abdominal pain. She was started on omeprazole 05-30-2019 and states she took this as prescribed for about a month with only minimal relief of symptoms. Pain is located in the lower epigastric area and described as sharp and burning. Worse after eating. She does not follow a bland diet. Eats lots of spicy food.  Had one episode of vomiting after eating a slice pizza. She also endorses  bilious reflux, heartburn, midespigastric pain, regurgitation of undigested food and symptoms primarily relate to meals, and lying down after meals.  She denies choking on food, cough, deep pressure at base of neck, dysphagia, hematemesis and melena. Symptoms have been present for a few years. Medical therapy in the past  has included antacids, H2 antagonists and proton pump inhibitors. She has a history of positive Hpylori int he past however recent Hpylori 02-2019 was negative.    Review of Systems  Constitutional: Negative for fever, malaise/fatigue and weight loss.  HENT: Negative.  Negative for nosebleeds.   Eyes: Negative.  Negative for blurred vision, double vision and photophobia.  Respiratory: Negative.  Negative for cough and shortness of breath.   Cardiovascular: Negative.  Negative for chest pain, palpitations and leg swelling.  Gastrointestinal: Positive for abdominal pain, heartburn and vomiting. Negative for blood in stool, constipation, diarrhea, melena and nausea.  Musculoskeletal: Positive for back pain. Negative for myalgias.  Neurological: Negative.  Negative for dizziness, focal weakness, seizures and headaches.  Psychiatric/Behavioral: Negative.  Negative for suicidal ideas.    Past Medical History:  Diagnosis Date   Arthritis    Mild OA changes T6-T7   Dysmenorrhea    GERD (gastroesophageal reflux disease)    H. pylori infection    Migraines     Past Surgical History:  Procedure Laterality Date   NO PAST SURGERIES      Family History  Problem Relation Age of Onset   Kidney disease Mother        dialysis, she is not certain of cause   Hypotension Mother    Diabetes Father    Diabetes Sister    Kidney disease Brother     Social History Reviewed with no  changes to be made today.   Outpatient Medications Prior to Visit  Medication Sig Dispense Refill   omeprazole (PRILOSEC) 40 MG capsule Take 1 capsule (40 mg total) by mouth daily. 90 capsule 1   ranitidine (ZANTAC) 300 MG tablet Take 1 tablet (300 mg total) by mouth at bedtime for 14 days. 14 tablet 1   No facility-administered medications prior to visit.     Allergies  Allergen Reactions   Prednisone     Dizziness, dry mouth and "throat closing, but not swelling"       Objective:    BP 103/68     Pulse 70    Temp 97.9 F (36.6 C) (Oral)    Resp 16    Wt 135 lb 6.4 oz (61.4 kg)    SpO2 95%    BMI 25.58 kg/m  Wt Readings from Last 3 Encounters:  07/11/19 135 lb 6.4 oz (61.4 kg)  02/28/19 138 lb 12.8 oz (63 kg)  01/23/19 138 lb (62.6 kg)    Physical Exam Vitals signs and nursing note reviewed.  Constitutional:      Appearance: She is well-developed.  HENT:     Head: Normocephalic and atraumatic.  Neck:     Musculoskeletal: Normal range of motion.  Cardiovascular:     Rate and Rhythm: Normal rate and regular rhythm.     Heart sounds: Normal heart sounds. No murmur. No friction rub. No gallop.   Pulmonary:     Effort: Pulmonary effort is normal. No tachypnea or respiratory distress.     Breath sounds: Normal breath sounds. No decreased breath sounds, wheezing, rhonchi or rales.  Chest:     Chest wall: No tenderness.  Abdominal:     General: Bowel sounds are normal.     Palpations: Abdomen is soft.  Musculoskeletal: Normal range of motion.  Skin:    General: Skin is warm and dry.  Neurological:     Mental Status: She is alert and oriented to person, place, and time.     Coordination: Coordination normal.  Psychiatric:        Behavior: Behavior normal. Behavior is cooperative.        Thought Content: Thought content normal.        Judgment: Judgment normal.          Patient has been counseled extensively about nutrition and exercise as well as the importance of adherence with medications and regular follow-up. The patient was given clear instructions to go to ER or return to medical center if symptoms don't improve, worsen or new problems develop. The patient verbalized understanding.   Follow-up: Return in about 4 weeks (around 08/08/2019) for GERD.   Gildardo Pounds, FNP-BC [facility] [place], [place]   07/13/2019, 2:16 PM

## 2019-07-13 ENCOUNTER — Encounter: Payer: Self-pay | Admitting: Nurse Practitioner

## 2019-07-17 ENCOUNTER — Encounter: Payer: Self-pay | Admitting: Emergency Medicine

## 2019-08-07 ENCOUNTER — Other Ambulatory Visit: Payer: Self-pay

## 2019-08-07 DIAGNOSIS — Z20822 Contact with and (suspected) exposure to covid-19: Secondary | ICD-10-CM

## 2019-08-08 LAB — NOVEL CORONAVIRUS, NAA: SARS-CoV-2, NAA: NOT DETECTED

## 2019-08-09 ENCOUNTER — Ambulatory Visit (HOSPITAL_COMMUNITY)
Admission: EM | Admit: 2019-08-09 | Discharge: 2019-08-09 | Disposition: A | Discharge status: Home / Self Care | Payer: HRSA Program | Attending: Urgent Care | Admitting: Urgent Care

## 2019-08-09 ENCOUNTER — Other Ambulatory Visit: Payer: Self-pay

## 2019-08-09 ENCOUNTER — Encounter (HOSPITAL_COMMUNITY): Payer: Self-pay | Admitting: Urgent Care

## 2019-08-09 DIAGNOSIS — R5381 Other malaise: Secondary | ICD-10-CM | POA: Insufficient documentation

## 2019-08-09 DIAGNOSIS — Z20828 Contact with and (suspected) exposure to other viral communicable diseases: Secondary | ICD-10-CM | POA: Diagnosis not present

## 2019-08-09 DIAGNOSIS — R519 Headache, unspecified: Secondary | ICD-10-CM | POA: Diagnosis present

## 2019-08-09 DIAGNOSIS — J0100 Acute maxillary sinusitis, unspecified: Secondary | ICD-10-CM | POA: Insufficient documentation

## 2019-08-09 DIAGNOSIS — Z833 Family history of diabetes mellitus: Secondary | ICD-10-CM | POA: Insufficient documentation

## 2019-08-09 DIAGNOSIS — Z888 Allergy status to other drugs, medicaments and biological substances status: Secondary | ICD-10-CM | POA: Insufficient documentation

## 2019-08-09 DIAGNOSIS — R6883 Chills (without fever): Secondary | ICD-10-CM | POA: Insufficient documentation

## 2019-08-09 DIAGNOSIS — Z87891 Personal history of nicotine dependence: Secondary | ICD-10-CM | POA: Diagnosis not present

## 2019-08-09 MED ORDER — AMOXICILLIN 875 MG PO TABS: 875.0000 mg | ORAL_TABLET | Freq: Two times a day (BID) | ORAL | 0 refills | Status: DC

## 2019-08-09 MED FILL — AMOXICILLIN 875 MG TABLET: 875 | 7 days supply | Qty: 14 | Fill #0

## 2019-08-09 NOTE — ED Triage Notes (Signed)
Seen by mani, pa 

## 2019-08-09 NOTE — ED Provider Notes (Signed)
[facility]   MRN: [id number] DOB: [birth year]  Subjective:   Sarah Andrews is a 48 y.o. female presenting for 3-4 day hx of acute onset worsening malaise.  Patient has tried over-the-counter Tylenol for colds.  States that she has had more persistent and intermittent sinus congestion, runny nose and intermittent moderate to severe headaches worse with her cycles.  She does have a history of migraines but has not really had these recently.  She did get seen yesterday at a different clinic, had rapid Covid testing which was negative.  Denies any known COVID-19 contacts.  Patient does cleaning at homes.  Denies taking any chronic medications.    Allergies  Allergen Reactions  . Prednisone     Dizziness, dry mouth and "throat closing, but not swelling"    Past Medical History:  Diagnosis Date  . Arthritis    Mild OA changes T6-T7  . Dysmenorrhea   . GERD (gastroesophageal reflux disease)   . H. pylori infection   . Migraines      Past Surgical History:  Procedure Laterality Date  . NO PAST SURGERIES      Family History  Problem Relation Age of Onset  . Kidney disease Mother        dialysis, she is not certain of cause  . Hypotension Mother   . Diabetes Father   . Diabetes Sister   . Kidney disease Brother     Social History   Tobacco Use  . Smoking status: Former Smoker    Years: 1.00    Quit date: 04/07/2013    Years since quitting: 6.3  . Smokeless tobacco: Never Used  Substance Use Topics  . Alcohol use: Yes  . Drug use: No    Review of Systems  Constitutional: Positive for chills, fever and malaise/fatigue.  HENT: Positive for congestion. Negative for ear pain, sinus pain and sore throat.   Eyes: Negative for discharge and redness.  Respiratory: Negative for cough, hemoptysis, shortness of breath and wheezing.   Cardiovascular: Negative for chest pain.  Gastrointestinal: Negative for abdominal pain, diarrhea, nausea and vomiting.   Genitourinary: Negative for dysuria, flank pain and hematuria.  Musculoskeletal: Negative for myalgias.  Skin: Negative for rash.  Neurological: Positive for headaches. Negative for dizziness and weakness.  Psychiatric/Behavioral: Negative for depression and substance abuse.     Objective:   Vitals: Pulse Ox 100% Pulse 95 BP 117/72, left arm, seated position. Temp 98.59F RR 16  No travel outside the country. LMP 08/02/2019, was regular.   Physical Exam Constitutional:      General: She is not in acute distress.    Appearance: Normal appearance. She is well-developed. She is not ill-appearing, toxic-appearing or diaphoretic.  HENT:     Head: Normocephalic and atraumatic.     Nose: Nose normal.     Mouth/Throat:     Mouth: Mucous membranes are moist.  Eyes:     Extraocular Movements: Extraocular movements intact.     Pupils: Pupils are equal, round, and reactive to light.  Cardiovascular:     Rate and Rhythm: Normal rate and regular rhythm.     Pulses: Normal pulses.     Heart sounds: Normal heart sounds. No murmur. No friction rub. No gallop.   Pulmonary:     Effort: Pulmonary effort is normal. No respiratory distress.     Breath sounds: Normal breath sounds. No stridor. No wheezing, rhonchi or rales.  Skin:    General: Skin is warm and dry.  Findings: No rash.  Neurological:     Mental Status: She is alert and oriented to person, place, and time.  Psychiatric:        Mood and Affect: Mood normal.        Behavior: Behavior normal.        Thought Content: Thought content normal.     Assessment and Plan :   1. Acute non-recurrent maxillary sinusitis   2. Sinus headache   3. Malaise   4. Chills     Will cover for sinusitis with amoxicillin twice daily.  COVID-19 testing pending.  Recommended supportive care otherwise. Counseled patient on potential for adverse effects with medications prescribed/recommended today, ER and return-to-clinic precautions discussed,  patient verbalized understanding.    Jaynee Eagles, PA-C 08/09/19 1331

## 2019-08-09 NOTE — Discharge Instructions (Addendum)
Para el dolor de garganta o tos intente usar un t a base de miel. Use 3 cucharaditas de miel con jugo exprimido de [employer]. Coloque trozos de Pension scheme manager en 1 / 2-1 taza de agua y caliente sobre la estufa. Luego mezcle los ingredientes y repita cada 4 horas segn sea necesario. Tome ibuprofeno 400 mg cada 6 horas alternando con OR junto con Tylenol 500 mg cada 6 horas. Hidrata muy bien con al menos 2 litros de [place]. Coma comidas ligeras como sopas para [employer] electrolitos y coma frutas suaves, verduras. Comience un antihistamnico como Zyrtec (cetirizine), 10mg  cada dia. Puede recoger pseudoefedrina de venta libre (Sudafed) y usarla para el drenaje posnasal, la congestin nasal a una dosis de 60 mg cada 8 horas o 120 mg cada 12 horas, segn sea necesario. Use el jarabe por la noche para su tos y las capsulas durante el dia.

## 2019-08-11 LAB — NOVEL CORONAVIRUS, NAA (HOSP ORDER, SEND-OUT TO REF LAB; TAT 18-24 HRS): SARS-CoV-2, NAA: NOT DETECTED

## 2019-08-14 ENCOUNTER — Ambulatory Visit: Payer: Self-pay | Attending: Nurse Practitioner | Admitting: Nurse Practitioner

## 2019-08-14 ENCOUNTER — Encounter: Payer: Self-pay | Admitting: Nurse Practitioner

## 2019-08-14 DIAGNOSIS — K279 Peptic ulcer, site unspecified, unspecified as acute or chronic, without hemorrhage or perforation: Secondary | ICD-10-CM

## 2019-08-14 DIAGNOSIS — R1013 Epigastric pain: Secondary | ICD-10-CM

## 2019-08-14 DIAGNOSIS — K219 Gastro-esophageal reflux disease without esophagitis: Secondary | ICD-10-CM

## 2019-08-14 MED ORDER — RANITIDINE HCL 300 MG PO TABS: 300.0000 mg | ORAL_TABLET | Freq: Every day | ORAL | 1 refills | Status: DC

## 2019-08-14 MED ORDER — PANTOPRAZOLE SODIUM 40 MG PO TBEC: 40.0000 mg | DELAYED_RELEASE_TABLET | Freq: Every day | ORAL | 3 refills | Status: DC

## 2019-08-14 MED FILL — PANTOPRAZOLE SOD DR 40 MG T: 40 | 30 days supply | Qty: 30 | Fill #0

## 2019-08-14 NOTE — Progress Notes (Signed)
Virtual Visit via Telephone Note Due to national recommendations of social distancing due to COVID 19, telehealth visit is felt to be most appropriate for this patient at this time.  I discussed the limitations, risks, security and privacy concerns of performing an evaluation and management service by telephone and the availability of in person appointments. I also discussed with the patient that there may be a patient responsible charge related to this service. The patient expressed understanding and agreed to proceed.    I connected with Sarah Andrews on 08/14/19  at   2:10 PM EST  EDT by telephone and verified that I am speaking with the correct person using two identifiers.   Consent I discussed the limitations, risks, security and privacy concerns of performing an evaluation and management service by telephone and the availability of in person appointments. I also discussed with the patient that there may be a patient responsible charge related to this service. The patient expressed understanding and agreed to proceed.   Location of Patient: Private Residence   Location of Provider: [facility]    Persons participating in Telemedicine visit: Bertram Denver FNP-BC [facility] CMA Lyanne Kates  Interpreter ERIC [id number]   History of Present Illness: Telemedicine visit for: GERD She also requested results of her recent COVID tests. Both were negative in Nov. And Dec. She verbalized understanding and thanked me for relaying results to her.   GERD Chronic. Taking zantac 300 mg at bedtime. Taking protonix 40 mg daily. Symptoms are well controlled. Previous symptoms included:  Pain is located in the lower epigastric area described as sharp and burning, bilious reflux, heartburn, midespigastric pain, regurgitation of undigested food and symptoms primarily relate to meals, and lying down after meals.  Worse after eating. Eating  lots of spicy  food. She denies choking on food, cough, deep pressure at base of neck, dysphagia, hematemesis and melena.  Medical therapy in the past has included antacids, H2 antagonists and proton pump inhibitors. She has a history of positive Hpylori in the past however recent Hpylori 02-2019 was negative.     Past Medical History:  Diagnosis Date  . Arthritis    Mild OA changes T6-T7  . Dysmenorrhea   . GERD (gastroesophageal reflux disease)   . H. pylori infection   . Migraines     Past Surgical History:  Procedure Laterality Date  . NO PAST SURGERIES      Family History  Problem Relation Age of Onset  . Kidney disease Mother        dialysis, she is not certain of cause  . Hypotension Mother   . Diabetes Father   . Diabetes Sister   . Kidney disease Brother     Social History   Socioeconomic History  . Marital status: Single    Spouse name: Not on file  . Number of children: 1  . Years of education: 2 years college-accounting  . Highest education level: Not on file  Occupational History  . Occupation: Lexicographer homes  Social Needs  . Financial resource strain: Not on file  . Food insecurity    Worry: Not on file    Inability: Not on file  . Transportation needs    Medical: Not on file    Non-medical: Not on file  Tobacco Use  . Smoking status: Former Smoker    Years: 1.00    Quit date: 04/07/2013    Years since quitting: 6.3  . Smokeless tobacco: Never Used  Substance and Sexual Activity  . Alcohol use: Yes  . Drug use: No  . Sexual activity: Yes    Birth control/protection: None    Comment: wants to become pregnant  Lifestyle  . Physical activity    Days per week: Not on file    Minutes per session: Not on file  . Stress: Not on file  Relationships  . Social Herbalist on phone: Not on file    Gets together: Not on file    Attends religious service: Not on file    Active member of club or organization: Not on file    Attends meetings of clubs or  organizations: Not on file    Relationship status: Not on file  Other Topics Concern  . Not on file  Social History Narrative   Originally from [place]   Came to [place] in 2002   Lives with her 46 yo son.   He goes to [employer]         Observations/Objective: Awake, alert and oriented x 3   Review of Systems  Constitutional: Negative for fever, malaise/fatigue and weight loss.  HENT: Negative.  Negative for nosebleeds.   Eyes: Negative.  Negative for blurred vision, double vision and photophobia.  Respiratory: Negative.  Negative for cough and shortness of breath.   Cardiovascular: Negative.  Negative for chest pain, palpitations and leg swelling.  Gastrointestinal: Positive for heartburn. Negative for nausea and vomiting.  Musculoskeletal: Negative.  Negative for myalgias.  Neurological: Negative.  Negative for dizziness, focal weakness, seizures and headaches.  Psychiatric/Behavioral: Negative.  Negative for suicidal ideas.    Assessment and Plan:  Diagnoses and all orders for this visit:  GERD without esophagitis -     pantoprazole (PROTONIX) 40 MG tablet; Take 1 tablet (40 mg total) by mouth daily. -     ranitidine (ZANTAC) 300 MG tablet; Take 1 tablet (300 mg total) by mouth at bedtime. INSTRUCTIONS: Avoid GERD Triggers: acidic, spicy or fried foods, caffeine, coffee, sodas,  alcohol and chocolate.   Epigastric pain -     pantoprazole (PROTONIX) 40 MG tablet; Take 1 tablet (40 mg total) by mouth daily. -     ranitidine (ZANTAC) 300 MG tablet; Take 1 tablet (300 mg total) by mouth at bedtime.  Peptic ulcer disease -     pantoprazole (PROTONIX) 40 MG tablet; Take 1 tablet (40 mg total) by mouth daily. -     ranitidine (ZANTAC) 300 MG tablet; Take 1 tablet (300 mg total) by mouth at bedtime.     Follow Up Instructions Return if symptoms worsen or fail to improve.     I discussed the assessment and treatment plan with the patient. The patient was provided an  opportunity to ask questions and all were answered. The patient agreed with the plan and demonstrated an understanding of the instructions.   The patient was advised to call back or seek an in-person evaluation if the symptoms worsen or if the condition fails to improve as anticipated.  I provided 22 minutes of non-face-to-face time during this encounter including median intraservice time, reviewing previous notes, labs, imaging, medications and explaining diagnosis and management.  Gildardo Pounds, FNP-BC

## 2020-05-29 ENCOUNTER — Encounter (HOSPITAL_COMMUNITY): Payer: Self-pay

## 2020-05-29 ENCOUNTER — Other Ambulatory Visit: Payer: Self-pay

## 2020-05-29 ENCOUNTER — Ambulatory Visit (HOSPITAL_COMMUNITY)
Admission: EM | Admit: 2020-05-29 | Discharge: 2020-05-29 | Disposition: A | Discharge status: Home / Self Care | Payer: Self-pay | Attending: Family Medicine | Admitting: Family Medicine

## 2020-05-29 DIAGNOSIS — R109 Unspecified abdominal pain: Secondary | ICD-10-CM

## 2020-05-29 DIAGNOSIS — R1032 Left lower quadrant pain: Secondary | ICD-10-CM

## 2020-05-29 LAB — POCT URINALYSIS DIPSTICK, ED / UC
Bilirubin Urine: NEGATIVE
Glucose, UA: NEGATIVE mg/dL
Ketones, ur: 15 mg/dL — AB
Leukocytes,Ua: NEGATIVE
Nitrite: NEGATIVE
Protein, ur: NEGATIVE mg/dL
Specific Gravity, Urine: 1.02 (ref 1.005–1.030)
Urobilinogen, UA: 0.2 mg/dL (ref 0.0–1.0)
pH: 6.5 (ref 5.0–8.0)

## 2020-05-29 MED ORDER — HYDROCODONE-ACETAMINOPHEN 5-325 MG PO TABS: 1.0000 | ORAL_TABLET | Freq: Four times a day (QID) | ORAL | 0 refills | Status: DC | PRN

## 2020-05-29 MED ORDER — TAMSULOSIN HCL 0.4 MG PO CAPS: 0.4000 mg | ORAL_CAPSULE | Freq: Every day | ORAL | 0 refills | Status: DC

## 2020-05-29 NOTE — ED Triage Notes (Signed)
Pt presents with generalized abdominal pain with some nausea since yesterday.

## 2020-05-29 NOTE — ED Provider Notes (Signed)
[facility]    CSN: [id number] Arrival date & time: 05/29/20  1558      History   Chief Complaint Chief Complaint  Patient presents with   Abdominal Pain    HPI Sarah Andrews is a 49 y.o. female.   Patient presenting today with 1 day of lower abdominal and left flank pain that she states came out of nowhere when she stood up from sitting yesterday and was almost crippling in severity. The pain improved some overnight but has started back up again today. Urination is very painful at times as well. Denies N/V/D, fever, vaginal discharge or exposures to STIs, new foods, recent travel, sick contacts. Was treated in the past year for H pylori but otherwise no known GI issues. Not trying anything for relief of sxs.      Past Medical History:  Diagnosis Date   Arthritis    Mild OA changes T6-T7   Dysmenorrhea    GERD (gastroesophageal reflux disease)    H. pylori infection    Migraines     Patient Active Problem List   Diagnosis Date Noted   Chronic right-sided thoracic back pain 07/04/2017   Arthritis    Low grade squamous intraepithelial lesion (LGSIL) on cervical Pap smear 02/10/2016   Dysfunctional uterine bleeding 04/08/2011    Past Surgical History:  Procedure Laterality Date   NO PAST SURGERIES      OB History    Gravida  2   Para  1   Term  1   Preterm  0   AB  1   Living  1     SAB  1   TAB  0   Ectopic  0   Multiple      Live Births  1            Home Medications    Prior to Admission medications   Medication Sig Start Date End Date Taking? Authorizing Provider  amoxicillin (AMOXIL) 875 MG tablet Take 1 tablet (875 mg total) by mouth 2 (two) times daily. 08/09/19   Wallis Bamberg, PA-C  HYDROcodone-acetaminophen (NORCO/VICODIN) 5-325 MG tablet Take 1 tablet by mouth every 6 (six) hours as needed for moderate pain. 05/29/20   Particia Nearing, PA-C  pantoprazole (PROTONIX) 40 MG tablet Take 1 tablet  (40 mg total) by mouth daily. 08/14/19 11/12/19  Claiborne Rigg, NP  ranitidine (ZANTAC) 300 MG tablet Take 1 tablet (300 mg total) by mouth at bedtime. 08/14/19 11/12/19  Claiborne Rigg, NP  tamsulosin (FLOMAX) 0.4 MG CAPS capsule Take 1 capsule (0.4 mg total) by mouth daily. 05/29/20   Particia Nearing, PA-C    Family History Family History  Problem Relation Age of Onset   Kidney disease Mother        dialysis, she is not certain of cause   Hypotension Mother    Diabetes Father    Diabetes Sister    Kidney disease Brother     Social History Social History   Tobacco Use   Smoking status: Former Smoker    Years: 1.00    Quit date: 04/07/2013    Years since quitting: 7.1   Smokeless tobacco: Never Used  Vaping Use   Vaping Use: Never used  Substance Use Topics   Alcohol use: Yes   Drug use: No     Allergies   Prednisone   Review of Systems Review of Systems PER HPI    Physical Exam Triage Vital Signs ED  Triage Vitals  Enc Vitals Group     BP 05/29/20 1901 116/76     Pulse Rate 05/29/20 1901 75     Resp 05/29/20 1901 17     Temp 05/29/20 1901 98.2 F (36.8 C)     Temp Source 05/29/20 1901 Oral     SpO2 05/29/20 1901 100 %     Weight --      Height --      Head Circumference --      Peak Flow --      Pain Score 05/29/20 1902 7     Pain Loc --      Pain Edu? --      Excl. in GC? --    No data found.  Updated Vital Signs BP 116/76 (BP Location: Left Arm)    Pulse 75    Temp 98.2 F (36.8 C) (Oral)    Resp 17    LMP 05/14/2020    SpO2 100%   Visual Acuity Right Eye Distance:   Left Eye Distance:   Bilateral Distance:    Right Eye Near:   Left Eye Near:    Bilateral Near:     Physical Exam Vitals and nursing note reviewed.  Constitutional:      Appearance: Normal appearance. She is not ill-appearing.  HENT:     Head: Atraumatic.     Mouth/Throat:     Mouth: Mucous membranes are moist.  Eyes:     Extraocular Movements:  Extraocular movements intact.     Conjunctiva/sclera: Conjunctivae normal.  Cardiovascular:     Rate and Rhythm: Normal rate and regular rhythm.     Heart sounds: Normal heart sounds.  Pulmonary:     Effort: Pulmonary effort is normal.     Breath sounds: Normal breath sounds.  Abdominal:     General: Bowel sounds are normal. There is no distension.     Palpations: Abdomen is soft.     Tenderness: There is abdominal tenderness (b/l lower abdomen ttp, worst on left). There is left CVA tenderness. There is no right CVA tenderness or guarding.  Musculoskeletal:        General: Normal range of motion.     Cervical back: Normal range of motion and neck supple.  Skin:    General: Skin is warm and dry.  Neurological:     Mental Status: She is alert and oriented to person, place, and time.  Psychiatric:        Mood and Affect: Mood normal.        Thought Content: Thought content normal.        Judgment: Judgment normal.      UC Treatments / Results  Labs (all labs ordered are listed, but only abnormal results are displayed) Labs Reviewed  POCT URINALYSIS DIPSTICK, ED / UC - Abnormal; Notable for the following components:      Result Value   Ketones, ur 15 (*)    Hgb urine dipstick MODERATE (*)    All other components within normal limits    EKG   Radiology No results found.  Procedures Procedures (including critical care time)  Medications Ordered in UC Medications - No data to display  Initial Impression / Assessment and Plan / UC Course  I have reviewed the triage vital signs and the nursing notes.  Pertinent labs & imaging results that were available during my care of the patient were reviewed by me and considered in my medical decision making (see chart for  details).     U/A today showing hg and no evidence of bacterial infection. With this and her significant flank/lower abdominal pain suspect nephrolithiasis to be causing her pain sxs. Will tx with fluids, flomax  and norco prn for severe pain and monitor closely over the next few days. Cannot r/o diverticulitis, ovarian torsion or cysts, etc so gave strict return precautions if sxs worsening or not improving. She declines blood work today and is agreeable to med mgmt for now and f/u if needed.    Final Clinical Impressions(s) / UC Diagnoses   Final diagnoses:  Left flank pain  Left lower quadrant abdominal pain   Discharge Instructions   None    ED Prescriptions    Medication Sig Dispense Auth. Provider   HYDROcodone-acetaminophen (NORCO/VICODIN) 5-325 MG tablet  (Status: Discontinued) Take 1 tablet by mouth every 6 (six) hours as needed for moderate pain. 20 tablet Particia Nearing, [place]   tamsulosin (FLOMAX) 0.4 MG CAPS capsule Take 1 capsule (0.4 mg total) by mouth daily. 30 capsule Particia Nearing, [place]   HYDROcodone-acetaminophen (NORCO/VICODIN) 5-325 MG tablet Take 1 tablet by mouth every 6 (six) hours as needed for moderate pain. 20 tablet Particia Nearing, [place]     I have reviewed the PDMP during this encounter.   Particia Nearing, [place] 05/29/20 2025

## 2020-06-04 ENCOUNTER — Other Ambulatory Visit: Payer: Self-pay

## 2020-06-04 ENCOUNTER — Encounter (HOSPITAL_COMMUNITY): Payer: Self-pay | Admitting: Pediatrics

## 2020-06-04 ENCOUNTER — Emergency Department (HOSPITAL_COMMUNITY)
Admission: EM | Admit: 2020-06-04 | Discharge: 2020-06-05 | Disposition: A | Discharge status: Home / Self Care | Payer: No Typology Code available for payment source | Attending: Emergency Medicine | Admitting: Emergency Medicine

## 2020-06-04 DIAGNOSIS — R309 Painful micturition, unspecified: Secondary | ICD-10-CM | POA: Insufficient documentation

## 2020-06-04 DIAGNOSIS — R1032 Left lower quadrant pain: Secondary | ICD-10-CM | POA: Insufficient documentation

## 2020-06-04 DIAGNOSIS — R1011 Right upper quadrant pain: Secondary | ICD-10-CM | POA: Insufficient documentation

## 2020-06-04 DIAGNOSIS — Z5321 Procedure and treatment not carried out due to patient leaving prior to being seen by health care provider: Secondary | ICD-10-CM | POA: Insufficient documentation

## 2020-06-04 LAB — URINALYSIS, ROUTINE W REFLEX MICROSCOPIC
Bacteria, UA: NONE SEEN
Bilirubin Urine: NEGATIVE
Glucose, UA: NEGATIVE mg/dL
Ketones, ur: NEGATIVE mg/dL
Leukocytes,Ua: NEGATIVE
Nitrite: NEGATIVE
Protein, ur: NEGATIVE mg/dL
Specific Gravity, Urine: 1.008 (ref 1.005–1.030)
pH: 6 (ref 5.0–8.0)

## 2020-06-04 LAB — CBC
HCT: 39.1 % (ref 36.0–46.0)
Hemoglobin: 12.9 g/dL (ref 12.0–15.0)
MCH: 31.2 pg (ref 26.0–34.0)
MCHC: 33 g/dL (ref 30.0–36.0)
MCV: 94.7 fL (ref 80.0–100.0)
Platelets: 293 10*3/uL (ref 150–400)
RBC: 4.13 MIL/uL (ref 3.87–5.11)
RDW: 12.9 % (ref 11.5–15.5)
WBC: 7.2 10*3/uL (ref 4.0–10.5)
nRBC: 0 % (ref 0.0–0.2)

## 2020-06-04 LAB — COMPREHENSIVE METABOLIC PANEL
ALT: 13 U/L (ref 0–44)
AST: 16 U/L (ref 15–41)
Albumin: 3.9 g/dL (ref 3.5–5.0)
Alkaline Phosphatase: 51 U/L (ref 38–126)
Anion gap: 10 (ref 5–15)
BUN: 16 mg/dL (ref 6–20)
CO2: 23 mmol/L (ref 22–32)
Calcium: 9.4 mg/dL (ref 8.9–10.3)
Chloride: 104 mmol/L (ref 98–111)
Creatinine, Ser: 0.63 mg/dL (ref 0.44–1.00)
GFR calc Af Amer: >60 mL/min (ref >60)
GFR calc non Af Amer: >60 mL/min (ref >60)
Glucose, Bld: 94 mg/dL (ref 70–99)
Potassium: 3.7 mmol/L (ref 3.5–5.1)
Sodium: 137 mmol/L (ref 135–145)
Total Bilirubin: 0.4 mg/dL (ref 0.3–1.2)
Total Protein: 7.3 g/dL (ref 6.5–8.1)

## 2020-06-04 LAB — I-STAT BETA HCG BLOOD, ED (MC, WL, AP ONLY): I-stat hCG, quantitative: <5 m[IU]/mL (ref <5)

## 2020-06-04 LAB — LIPASE, BLOOD: Lipase: 45 U/L (ref 11–51)

## 2020-06-04 NOTE — ED Triage Notes (Signed)
C/o cramping of left lower abdominal pain and right upper abdominal pain x 1 week. Painful urination. Reported was seen at [facility] and had an inconclusive urinalysis.

## 2020-06-05 NOTE — ED Provider Notes (Signed)
I went to the room to examine the patient.  The door was open.  The blood pressure cuff was on the bed and the patient's wrist bracelet had been taken off.  Staff reports that the patient was noted to be walking towards the exit.  I did not see or evaluate this patient prior to her leaving the department.   Barkley Boards, PA-C 06/05/20 [id number]    Marily Memos, MD 06/06/20 [phone]

## 2021-05-14 ENCOUNTER — Ambulatory Visit (HOSPITAL_COMMUNITY)
Admission: EM | Admit: 2021-05-14 | Discharge: 2021-05-14 | Disposition: A | Discharge status: Home / Self Care | Payer: Self-pay

## 2021-05-14 ENCOUNTER — Other Ambulatory Visit: Payer: Self-pay

## 2021-05-14 NOTE — ED Notes (Signed)
Pt called for registration with no answer.  

## 2022-02-12 ENCOUNTER — Other Ambulatory Visit: Payer: Self-pay | Admitting: Obstetrics & Gynecology

## 2022-02-12 DIAGNOSIS — Z1231 Encounter for screening mammogram for malignant neoplasm of breast: Secondary | ICD-10-CM

## 2022-02-19 ENCOUNTER — Ambulatory Visit
Admission: RE | Admit: 2022-02-19 | Discharge: 2022-02-19 | Disposition: A | Discharge status: Home / Self Care | Payer: No Typology Code available for payment source | Source: Ambulatory Visit | Attending: Obstetrics & Gynecology | Admitting: Obstetrics & Gynecology

## 2022-02-19 DIAGNOSIS — Z1231 Encounter for screening mammogram for malignant neoplasm of breast: Secondary | ICD-10-CM

## 2022-02-25 ENCOUNTER — Other Ambulatory Visit: Payer: Self-pay | Admitting: Obstetrics and Gynecology

## 2022-02-25 DIAGNOSIS — R928 Other abnormal and inconclusive findings on diagnostic imaging of breast: Secondary | ICD-10-CM

## 2022-03-09 ENCOUNTER — Ambulatory Visit: Payer: Self-pay | Admitting: *Deleted

## 2022-03-09 VITALS — BP 102/70 | Wt 138.8 lb

## 2022-03-09 DIAGNOSIS — Z1239 Encounter for other screening for malignant neoplasm of breast: Secondary | ICD-10-CM

## 2022-03-09 DIAGNOSIS — N644 Mastodynia: Secondary | ICD-10-CM

## 2022-03-09 DIAGNOSIS — Z1211 Encounter for screening for malignant neoplasm of colon: Secondary | ICD-10-CM

## 2022-03-09 NOTE — Progress Notes (Signed)
[name] is a 51 y.o. female who presents to [facility] clinic today with complaint of bilateral diffuse breast pain x 1.5 months that comes and goes. Patient rates the pain at a 2 out of 10. Patient had a screening mammogram completed 02/19/2022 that additional imaging of the right breast is recommended for follow up.   Pap Smear: Pap smear not completed today. Last Pap smear was in May 2023 at the [facility] clinic and was normal per patient. Patient has history of an abnormal Pap smear 12/25/2015 that was LGSIL with positive HPV that a colposcopy was completed for follow up that was benign. Per patient her last Pap smear was her second normal Pap smear since colposcopy. Last Pap smear result is not available in Epic. Previous Pap smear result is in Epic.   Physical exam: Breasts Breasts symmetrical. No skin abnormalities bilateral breasts. No nipple retraction bilateral breasts. No nipple discharge bilateral breasts. No lymphadenopathy. No lumps palpated bilateral breasts. No complaints of pain or tenderness on exam.      MS DIGITAL SCREENING TOMO BILATERAL  Result Date: 02/23/2022 CLINICAL DATA:  Screening. EXAM: DIGITAL SCREENING BILATERAL MAMMOGRAM WITH TOMOSYNTHESIS AND CAD TECHNIQUE: Bilateral screening digital craniocaudal and mediolateral oblique mammograms were obtained. Bilateral screening digital breast tomosynthesis was performed. The images were evaluated with computer-aided detection. COMPARISON:  Previous exam(s). ACR Breast Density Category c: The breast tissue is heterogeneously dense, which may obscure small masses. FINDINGS: In the right breast, a possible asymmetry warrants further evaluation. In the left breast, no findings suspicious for malignancy. IMPRESSION: Further evaluation is suggested for possible asymmetry in the right breast. RECOMMENDATION: Diagnostic mammogram and possibly ultrasound of the right breast. (Code:[id number]-40M) The patient will  be contacted regarding the findings, and additional imaging will be scheduled. BI-RADS CATEGORY  0: Incomplete. Need additional imaging evaluation and/or prior mammograms for comparison. Electronically Signed   By: Baird Lyons M.D.   On: 02/23/2022 09:16    Pelvic/Bimanual Pap is not indicated today per BCCCP guidelines.   Smoking History: Patient is a former smoker that quit 04/07/2013.   Patient Navigation: Patient education provided. Access to services provided for patient through [facility] program. Spanish interpreter Thomasene Mohair from [facility] provided.   Colorectal Cancer Screening: Per patient has never had colonoscopy completed. FIT Test given to patient to complete. No complaints today.    Breast and Cervical Cancer Risk Assessment: Patient does not have family history of breast cancer, known genetic mutations, or radiation treatment to the chest before age 69. Patient has history of cervical dysplasia. Patient has no history of being immunocompromised or DES exposure in-utero.  Risk Assessment     Risk Scores       03/09/2022   Last edited by: Meryl Dare, CMA   5-year risk: 1 %   Lifetime risk: 9.6 %            A: BCCCP exam without pap smear Complaint of bilateral breast pain.  P: Referred patient to the [facility] for a right diagnostic mammogram per recommendation. Appointment scheduled Thursday, March 12, 2022 at 1450.  Sarah Heidelberg, RN 03/09/2022 10:57 AM

## 2022-03-09 NOTE — Patient Instructions (Signed)
Explained breast self awareness with Raquel James. Patient did not need a Pap smear today due to last Pap smear was in May 2023 per patient. Let patient know that her next Pap smear will be due in one year due to her history of an abnormal Pap smear. Referred patient to the [facility] for a right diagnostic mammogram per recommendation. Appointment scheduled Thursday, March 12, 2022 at 1450. Patient aware of appointment and will be there. Raquel James verbalized understanding.  Dandra Shambaugh, Kathaleen Maser, RN 10:57 AM

## 2022-03-12 ENCOUNTER — Ambulatory Visit
Admission: RE | Admit: 2022-03-12 | Discharge: 2022-03-12 | Disposition: A | Discharge status: Home / Self Care | Payer: No Typology Code available for payment source | Source: Ambulatory Visit | Attending: Obstetrics and Gynecology | Admitting: Obstetrics and Gynecology

## 2022-03-12 DIAGNOSIS — R928 Other abnormal and inconclusive findings on diagnostic imaging of breast: Secondary | ICD-10-CM

## 2023-02-08 ENCOUNTER — Telehealth: Payer: Self-pay

## 2023-02-08 NOTE — Telephone Encounter (Signed)
Atttempted to call patient, no answer and voice mail not an option.

## 2023-02-12 ENCOUNTER — Encounter (HOSPITAL_COMMUNITY): Payer: Self-pay | Admitting: Emergency Medicine

## 2023-02-12 ENCOUNTER — Ambulatory Visit (HOSPITAL_COMMUNITY)
Admission: EM | Admit: 2023-02-12 | Discharge: 2023-02-12 | Disposition: A | Discharge status: Home / Self Care | Payer: No Typology Code available for payment source | Attending: Emergency Medicine | Admitting: Emergency Medicine

## 2023-02-12 DIAGNOSIS — H00015 Hordeolum externum left lower eyelid: Secondary | ICD-10-CM

## 2023-02-12 DIAGNOSIS — H01005 Unspecified blepharitis left lower eyelid: Secondary | ICD-10-CM

## 2023-02-12 MED ORDER — ERYTHROMYCIN 5 MG/GM OP OINT: TOPICAL_OINTMENT | OPHTHALMIC | 0 refills | Status: DC

## 2023-02-12 NOTE — Discharge Instructions (Addendum)
Tienes un orzuelo en el prpado inferior izquierdo. Utilice compresas tibias en el ojo izquierdo y un trapo limpio cada vez. Utilice la pomada antibitica segn lo prescrito despus de completar las compresas tibias. Puede alternar entre Tylenol e ibuprofeno cada 4 a 6 horas para cualquier dolor o inflamacin.  Algunas personas son ms propensas a Field seismologist. Asegrese de limpiarse la cara por la maana y antes de [place]. Si sus sntomas persisten, puede realizar un seguimiento con un oftalmlogo. Regrese a la clnica si presenta cualquier sntoma nuevo o preocupante.  You have a stye of your left lower eyelid. Please use warm compresses to your left eye, a clean rag each time. Please use the antibiotic ointment as prescribed after you complete the warm compresses.  You can alternate between Tylenol and ibuprofen every 4-6 hours for any pain or inflammation.  Some people are more prone to styes.  Please ensure you are cleaning your face in the morning and prior to bed.  If your symptoms persist, you can follow-up with an eye doctor.  Please return to clinic for any new or concerning symptoms.

## 2023-02-12 NOTE — ED Triage Notes (Signed)
Pt requested Spanish interpretor.   Pt reports has left lower eye swelling since last Tuesday. Reports itching and pain. Also has pain in face and ear on left side. Put warm cloth and Mexican ointment that is good for the eyes but didn't help. Terramicine was name of ointment.

## 2023-02-12 NOTE — ED Provider Notes (Signed)
[facility]    CSN: [id number] Arrival date & time: 02/12/23  0807      History   Chief Complaint Chief Complaint  Patient presents with   Facial Swelling    HPI Sarah Andrews is a 52 y.o. female.   Patient reports swelling and pain to her left lower eyelid for the past 2 or 3 days.  She has had increased tearing to her left eye but no purulent discharge.  Reports vision changes when her vision tears up.  She has been doing warm compresses and antibiotic cream from [place].  She does not wear contacts or glasses.  Reports this is a recurrent issue, happens every 5 or 6 months.  Afebrile.  Medical language interpreter used.  The history is provided by the patient and medical records. The history is limited by a language barrier. A language interpreter was used.    Past Medical History:  Diagnosis Date   Arthritis    Mild OA changes T6-T7   Dysmenorrhea    GERD (gastroesophageal reflux disease)    H. pylori infection    Migraines     Patient Active Problem List   Diagnosis Date Noted   Chronic right-sided thoracic back pain 07/04/2017   Arthritis    Low grade squamous intraepithelial lesion (LGSIL) on cervical Pap smear 02/10/2016   Dysfunctional uterine bleeding 04/08/2011    Past Surgical History:  Procedure Laterality Date   NO PAST SURGERIES      OB History     Gravida  2   Para  1   Term  1   Preterm  0   AB  1   Living  1      SAB  1   IAB  0   Ectopic  0   Multiple      Live Births  1            Home Medications    Prior to Admission medications   Medication Sig Start Date End Date Taking? Authorizing Provider  erythromycin ophthalmic ointment Place a 1/2 inch ribbon of ointment into the lower eyelid 4x daily x5 days. 02/12/23  Yes Farris Geiman, [place] N, FNP    Family History Family History  Problem Relation Age of Onset   Kidney disease Mother        dialysis, she is not certain of cause   Hypotension  Mother    Diabetes Father    Diabetes Sister    Kidney disease Brother    Breast cancer Neg Hx     Social History Social History   Tobacco Use   Smoking status: Former    Years: 1    Types: Cigarettes    Quit date: 04/07/2013    Years since quitting: 9.8   Smokeless tobacco: Never  Vaping Use   Vaping Use: Never used  Substance Use Topics   Alcohol use: Yes    Comment: occ.   Drug use: No     Allergies   Prednisone   Review of Systems Review of Systems  Constitutional:  Negative for fever.  Eyes:  Positive for pain and visual disturbance. Negative for photophobia, discharge, redness and itching.     Physical Exam Triage Vital Signs ED Triage Vitals  Enc Vitals Group     BP 02/12/23 0855 111/75     Pulse Rate 02/12/23 0855 63     Resp 02/12/23 0855 15     Temp 02/12/23 0855 98.7 F (37.1 C)  Temp Source 02/12/23 0855 Oral     SpO2 02/12/23 0855 97 %     Weight --      Height --      Head Circumference --      Peak Flow --      Pain Score 02/12/23 0851 8     Pain Loc --      Pain Edu? --      Excl. in GC? --    No data found.  Updated Vital Signs BP 111/75 (BP Location: Right Arm)   Pulse 63   Temp 98.7 F (37.1 C) (Oral)   Resp 15   SpO2 97%   Visual Acuity Right Eye Distance:   Left Eye Distance:   Bilateral Distance:    Right Eye Near:   Left Eye Near:    Bilateral Near:     Physical Exam Vitals and nursing note reviewed.  Constitutional:      Appearance: Normal appearance.  HENT:     Head: Normocephalic and atraumatic.     Right Ear: External ear normal.     Left Ear: External ear normal.     Nose: Nose normal.     Mouth/Throat:     Mouth: Mucous membranes are moist.  Eyes:     General: No scleral icterus.       Right eye: No discharge.        Left eye: Hordeolum present.No discharge.     Conjunctiva/sclera: Conjunctivae normal.     Pupils: Pupils are equal, round, and reactive to light.      Comments: Hordeolum and  associated blepharitis to left lower eyelid.  Head noted to the stye, with scant purulent drainage.  Cardiovascular:     Rate and Rhythm: Normal rate.  Pulmonary:     Effort: Pulmonary effort is normal. No respiratory distress.  Neurological:     General: No focal deficit present.     Mental Status: She is alert and oriented to person, place, and time.  Psychiatric:        Mood and Affect: Mood normal.        Behavior: Behavior normal. Behavior is cooperative.      UC Treatments / Results  Labs (all labs ordered are listed, but only abnormal results are displayed) Labs Reviewed - No data to display  EKG   Radiology No results found.  Procedures Procedures (including critical care time)  Medications Ordered in UC Medications - No data to display  Initial Impression / Assessment and Plan / UC Course  I have reviewed the triage vital signs and the nursing notes.  Pertinent labs & imaging results that were available during my care of the patient were reviewed by me and considered in my medical decision making (see chart for details).  Vitals and triage reviewed, patient is hemodynamically stable.  Hordeolum to left lower eyelid with purulent drainage from head.  Expressed scant amount of drainage in clinic from the stye.  Encouraged warm compress, does have left lower eyelid blepharitis, will cover with erythromycin ointment to help prevent against infection.  Symptomatic management discussed. Return and follow-up precautions given, no questions at this time.    Final Clinical Impressions(s) / UC Diagnoses   Final diagnoses:  Hordeolum externum of left lower eyelid  Blepharitis of left lower eyelid, unspecified type     Discharge Instructions      Tienes un orzuelo en el prpado inferior izquierdo. Utilice compresas tibias en el ojo izquierdo y un trapo  limpio cada vez. Utilice la pomada antibitica segn lo prescrito despus de completar las compresas tibias. Puede  alternar entre Tylenol e ibuprofeno cada 4 a 6 horas para cualquier dolor o inflamacin.  Algunas personas son ms propensas a Field seismologist. Asegrese de limpiarse la cara por la maana y antes de [place]. Si sus sntomas persisten, puede realizar un seguimiento con un oftalmlogo. Regrese a la clnica si presenta cualquier sntoma nuevo o preocupante.  You have a stye of your left lower eyelid. Please use warm compresses to your left eye, a clean rag each time. Please use the antibiotic ointment as prescribed after you complete the warm compresses.  You can alternate between Tylenol and ibuprofen every 4-6 hours for any pain or inflammation.  Some people are more prone to styes.  Please ensure you are cleaning your face in the morning and prior to bed.  If your symptoms persist, you can follow-up with an eye doctor.  Please return to clinic for any new or concerning symptoms.      ED Prescriptions     Medication Sig Dispense Auth. Provider   erythromycin ophthalmic ointment Place a 1/2 inch ribbon of ointment into the lower eyelid 4x daily x5 days. 3.5 g Sanam Marmo, [place] N, FNP      PDMP not reviewed this encounter.   Rinaldo Ratel [place] N, [place] 02/12/23 [phone]

## 2023-04-27 ENCOUNTER — Ambulatory Visit (HOSPITAL_COMMUNITY)
Admission: EM | Admit: 2023-04-27 | Discharge: 2023-04-27 | Disposition: A | Discharge status: Home / Self Care | Payer: Self-pay | Attending: Emergency Medicine | Admitting: Emergency Medicine

## 2023-04-27 ENCOUNTER — Encounter (HOSPITAL_COMMUNITY): Payer: Self-pay | Admitting: Emergency Medicine

## 2023-04-27 DIAGNOSIS — M7522 Bicipital tendinitis, left shoulder: Secondary | ICD-10-CM

## 2023-04-27 DIAGNOSIS — M75102 Unspecified rotator cuff tear or rupture of left shoulder, not specified as traumatic: Secondary | ICD-10-CM

## 2023-04-27 MED ORDER — TIZANIDINE HCL 4 MG PO TABS: 4.0000 mg | ORAL_TABLET | Freq: Three times a day (TID) | ORAL | 0 refills | Status: DC | PRN

## 2023-04-27 MED ORDER — NAPROXEN 500 MG PO TABS: 500.0000 mg | ORAL_TABLET | Freq: Two times a day (BID) | ORAL | 0 refills | Status: DC

## 2023-04-27 NOTE — ED Triage Notes (Signed)
Used spanish interpretor  Pt c/o left shoulder and upper arm pain for 3 weeks. Pt reports "electro-shots, pin and needles". Reports unable to lift arm up esp in mornings and nights.  Pt now having pain in right shoulder for week and half.  Denies lots of lifting or new injury.  Tried taking Ibuprofen.   Reports hurt left shoulder years ago during [place].

## 2023-04-27 NOTE — ED Provider Notes (Signed)
HPI  SUBJECTIVE:  Sarah Andrews is a right-handed 52 y.o. female who presents with 3 weeks of constant left shoulder pain that radiates down her arm.  She describes the pain as electric shocks.  She reports grip weakness, left neck/trapezius pain, arm weakness secondary to pain.  No distal numbness or tingling, bruising, swelling, erythema, fevers, trauma to the neck or shoulder.  No change in her physical activity.  Denies recent heavy lifting or repetitive use of her arm.  No chest pain.  She has never had symptoms like this before.  She tried ibuprofen 600 mg x 3 with improvement in her symptoms.  Symptoms are worse with all movement and at the end of the day after work.  She works as a Advertising copywriter.  She has a past medical history of left shoulder injury many years ago, but never sought medical attention for this.  No history of known rotator cuff injury. Patient has a past medical history of thoracic spine arthritis, H. pylori infection, GERD. Patient has a past medical history of thoracic spine arthritis, H. pylori infection, GERD. PCP: None.  Orthopedics: None.  All history obtained through video interpreter.  Past Medical History:  Diagnosis Date   Arthritis    Mild OA changes T6-T7   Dysmenorrhea    GERD (gastroesophageal reflux disease)    H. pylori infection    Migraines     Past Surgical History:  Procedure Laterality Date   NO PAST SURGERIES      Family History  Problem Relation Age of Onset   Kidney disease Mother        dialysis, she is not certain of cause   Hypotension Mother    Diabetes Father    Diabetes Sister    Kidney disease Brother    Breast cancer Neg Hx     Social History   Tobacco Use   Smoking status: Former    Current packs/day: 0.00    Types: Cigarettes    Start date: 04/07/2012    Quit date: 04/07/2013    Years since quitting: 10.0   Smokeless tobacco: Never  Vaping Use   Vaping status: Never Used  Substance Use Topics   Alcohol use:  Yes    Comment: occ.   Drug use: No    No current facility-administered medications for this encounter.  Current Outpatient Medications:    naproxen (NAPROSYN) 500 MG tablet, Take 1 tablet (500 mg total) by mouth 2 (two) times daily., Disp: 20 tablet, Rfl: 0   tiZANidine (ZANAFLEX) 4 MG tablet, Take 1 tablet (4 mg total) by mouth every 8 (eight) hours as needed for muscle spasms., Disp: 30 tablet, Rfl: 0  Allergies  Allergen Reactions   Prednisone     Dizziness, dry mouth and "throat closing, but not swelling"     ROS  As noted in HPI.   Physical Exam  BP 121/81 (BP Location: Right Arm)   Pulse 62   Temp 98.3 F (36.8 C) (Oral)   Resp 15   SpO2 97%   Constitutional: Well developed, well nourished, no acute distress Eyes:  EOMI, conjunctiva normal bilaterally HENT: Normocephalic, atraumatic,mucus membranes moist Respiratory: Normal inspiratory effort Cardiovascular: Normal rate GI: nondistended skin: No rash, skin intact Musculoskeletal: L shoulder with ROM somewhat limited due to pain, Drop test painful but negative,  clavicle NT , A/C joint tender, scapula NT, proximal humerus NT , L trapezius  tender, shoulder joint  tender, Motor strength normal, Sensation intact LT over deltoid region,  distal NVI with hand having intact sensation and strength in the median, radial, and ulnar nerve distribution.   Pain with internal rotation, pain with external rotation, Positive tenderness in bicipital groove, positive empty can test,  positive liftoff test, pain, but no instability with abduction/external rotation. RP 2+  Neurologic: Alert & oriented x 3, no focal neuro deficits Psychiatric: Speech and behavior appropriate   ED Course   Medications - No data to display  Orders Placed This Encounter  Procedures   AMB referral to sports medicine    Referral Priority:   Routine    Referral Type:   Consultation    Number of Visits Requested:   1   Nursing Communication Please  set up with a PCP prior to discharge    Please set up with a PCP prior to discharge    Standing Status:   Standing    Number of Occurrences:   1    No results found for this or any previous visit (from the past 24 hour(s)). No results found.  ED Clinical Impression  1. Biceps tendinitis of left shoulder   2. Rotator cuff syndrome of left shoulder      ED Assessment/Plan     Presentation consistent with a biceps tendinitis/rotator cuff syndrome.  Deferring imaging in the absence of trauma.  Doubt dislocation or fracture.  Will send home with Zanaflex, Naprosyn/Tylenol.  Patient reports anaphylaxis with prednisone.  Placing referral to sports medicine if no better with conservative treatment.  Will also set up with a PCP prior to discharge for routine care.   Spent 30 minutes with this patient.  Using the video interpreter, discussed MDM, treatment plan, and plan for follow-up with patient. answered all questions.  patient agrees with plan.   Meds ordered this encounter  Medications   naproxen (NAPROSYN) 500 MG tablet    Sig: Take 1 tablet (500 mg total) by mouth 2 (two) times daily.    Dispense:  20 tablet    Refill:  0   tiZANidine (ZANAFLEX) 4 MG tablet    Sig: Take 1 tablet (4 mg total) by mouth every 8 (eight) hours as needed for muscle spasms.    Dispense:  30 tablet    Refill:  0      *This clinic note was created using Scientist, clinical (histocompatibility and immunogenetics). Therefore, there may be occasional mistakes despite careful proofreading.  ?    Domenick Gong, MD 04/27/23 1133

## 2023-04-27 NOTE — Discharge Instructions (Signed)
I have put in a referral to sports medicine.  I would follow-up with them.  Take the Naprosyn combined with Tylenol twice a day, Zanaflex for muscle spasms.  Heat or ice on your shoulder, whichever feels better.

## 2023-05-20 ENCOUNTER — Ambulatory Visit: Payer: Self-pay | Admitting: Family

## 2023-05-26 ENCOUNTER — Encounter: Payer: Self-pay | Admitting: Adult Health

## 2023-05-26 ENCOUNTER — Ambulatory Visit (INDEPENDENT_AMBULATORY_CARE_PROVIDER_SITE_OTHER): Payer: Self-pay | Admitting: Adult Health

## 2023-05-26 VITALS — BP 125/78 | HR 73 | Temp 98.0°F | Resp 18 | Ht 61.0 in | Wt 144.6 lb

## 2023-05-26 DIAGNOSIS — Z1211 Encounter for screening for malignant neoplasm of colon: Secondary | ICD-10-CM

## 2023-05-26 DIAGNOSIS — Z1231 Encounter for screening mammogram for malignant neoplasm of breast: Secondary | ICD-10-CM

## 2023-05-26 DIAGNOSIS — K5901 Slow transit constipation: Secondary | ICD-10-CM

## 2023-05-26 DIAGNOSIS — Z0000 Encounter for general adult medical examination without abnormal findings: Secondary | ICD-10-CM

## 2023-05-26 DIAGNOSIS — Z13820 Encounter for screening for osteoporosis: Secondary | ICD-10-CM

## 2023-05-26 DIAGNOSIS — Z7689 Persons encountering health services in other specified circumstances: Secondary | ICD-10-CM

## 2023-05-26 DIAGNOSIS — M25512 Pain in left shoulder: Secondary | ICD-10-CM

## 2023-05-26 MED ORDER — SENNA-DOCUSATE SODIUM 8.6-50 MG PO TABS: 2.0000 | ORAL_TABLET | Freq: Every day | ORAL | Status: AC

## 2023-05-26 NOTE — Progress Notes (Signed)
[facility]  Provider:  Kenard Gower DNP  Code Status:  Full Code  Goals of Care:     05/26/2023    8:39 AM  Advanced Directives  Does Patient Have a Medical Advance Directive? No  Would patient like information on creating a medical advance directive? No - Patient declined     Chief Complaint  Patient presents with   Establish Care    New patient. [facility] verifed    HPI: Patient is a 52 y.o. female seen today to establish care with [facility]. She was accompanied today by a [place] interpreter. She is married  and has a 41 year old son who is healthy. 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 She does not smoke nor drink alcohol.  She does Zumba 2X/week. She complained of constipation and pain on her left shoulder. She denies trauma. She is able to move LUE with slight limitation on the left shoulder. She stated that it is painful in the morning and gets better in the afternoon. She takes Naproxen BID for pain.   She declined flu vaccine. She stated that she got sick last time she had the flu vaccine. She stated that she had pap smear done  on May 2021 and had Tdap 3 years ago.  Past Medical History:  Diagnosis Date   Arthritis    Mild OA changes T6-T7   Dysmenorrhea    H. pylori infection    Migraines     Past Surgical History:  Procedure Laterality Date   NO PAST SURGERIES      No Known Allergies  Outpatient Encounter Medications as of 05/26/2023  Medication Sig   naproxen (NAPROSYN) 500 MG tablet Take 1 tablet (500 mg total) by mouth 2 (two) times daily.   tiZANidine (ZANAFLEX) 4 MG tablet Take 1 tablet (4 mg total) by mouth every 8 (eight) hours as needed for muscle spasms.   No facility-administered encounter medications on file as of 05/26/2023.    Review of Systems:  Review of Systems  Constitutional:  Negative for appetite change, chills, fatigue and fever.  HENT:  Negative for congestion, hearing loss, rhinorrhea and  sore throat.   Eyes: Negative.   Respiratory:  Negative for cough, shortness of breath and wheezing.   Cardiovascular:  Negative for chest pain, palpitations and leg swelling.  Gastrointestinal:  Positive for constipation. Negative for abdominal pain, diarrhea, nausea and vomiting.  Genitourinary:  Negative for dysuria.  Musculoskeletal:  Positive for arthralgias. Negative for back pain.       Left shoulder painful in the morning and gets better in the afternoon  Skin:  Negative for color change, rash and wound.  Neurological:  Negative for dizziness, weakness and headaches.  Psychiatric/Behavioral:  Negative for behavioral problems. The patient is not nervous/anxious.     Health Maintenance  Topic Date Due   Hepatitis C Screening  Never done   DTaP/Tdap/Td (1 - Tdap) Never done   Colonoscopy  Never done   Cervical Cancer Screening (HPV/Pap Cotest)  02/26/2020   Zoster Vaccines- Shingrix (1 of 2) Never done   INFLUENZA VACCINE  Never done   COVID-19 Vaccine (1 - 2023-24 season) Never done   MAMMOGRAM  02/20/2024   HIV Screening  Completed   HPV VACCINES  Aged Out    Physical Exam: Vitals:   05/26/23 0825  Pulse: 73  Resp: 18  Temp: 98 F (36.7 C)  SpO2: 97%  Weight: 144 lb 9.6 oz (65.6  kg)  Height: 5\' 1"  (1.549 m)   Body mass index is 27.32 kg/m. Physical Exam Constitutional:      Appearance: Normal appearance.  HENT:     Head: Normocephalic and atraumatic.     Nose: Nose normal.     Mouth/Throat:     Mouth: Mucous membranes are moist.  Eyes:     Conjunctiva/sclera: Conjunctivae normal.  Cardiovascular:     Rate and Rhythm: Normal rate and regular rhythm.  Pulmonary:     Effort: Pulmonary effort is normal.     Breath sounds: Normal breath sounds.  Abdominal:     General: Bowel sounds are normal.     Palpations: Abdomen is soft.  Musculoskeletal:        General: Normal range of motion.     Cervical back: Normal range of motion.  Skin:    General: Skin is  warm and dry.  Neurological:     General: No focal deficit present.     Mental Status: She is alert and oriented to person, place, and time.  Psychiatric:        Mood and Affect: Mood normal.        Behavior: Behavior normal.        Thought Content: Thought content normal.        Judgment: Judgment normal.     Labs reviewed: Basic Metabolic Panel: No results for input(s): "NA", "K", "CL", "CO2", "GLUCOSE", "BUN", "CREATININE", "CALCIUM", "MG", "PHOS", "TSH" in the last 8760 hours. Liver Function Tests: No results for input(s): "AST", "ALT", "ALKPHOS", "BILITOT", "PROT", "ALBUMIN" in the last 8760 hours. No results for input(s): "LIPASE", "AMYLASE" in the last 8760 hours. No results for input(s): "AMMONIA" in the last 8760 hours. CBC: No results for input(s): "WBC", "NEUTROABS", "HGB", "HCT", "MCV", "PLT" in the last 8760 hours. Lipid Panel: No results for input(s): "CHOL", "HDL", "LDLCALC", "TRIG", "CHOLHDL", "LDLDIRECT" in the last 8760 hours. Lab Results  Component Value Date   HGBA1C 5.6 02/20/2016    Procedures since last visit: No results found.  Assessment/Plan  1. Encounter to establish care -  established care with [facility]  2. Encounter for screening mammogram for malignant neoplasm of breast - MM 3D SCREENING MAMMOGRAM BILATERAL BREAST  3. Screen for colon cancer -  denies bloody stool - Ambulatory referral to Gastroenterology  4. Acute pain of left shoulder -  denies trauma -  painful in the morning and gets better in the afternoon, possibly due to osteoarthritis - DG Shoulder Left; Future  5. Encounter for screening and preventative care - Lipid panel; Future - CBC with Differential/Platelets; Future - Complete Metabolic Panel with eGFR; Future - Hemoglobin A1C; Future  6. Screening for osteoporosis - DG Bone Density; Future  7. Slow transit constipation -  will start on Senna-S -  instructed to increase fruits and vegetable intake -  sennosides-docusate sodium (SENOKOT-S) 8.6-50 MG tablet; Take 2 tablets by mouth daily.    Labs/tests ordered:  - Lipid panel; Future - CBC with Differential/Platelets; Future - Complete Metabolic Panel with eGFR; Future - Hemoglobin A1C; Future - DG Bone Density; Future - DG Shoulder Left; Future - MM 3D SCREENING MAMMOGRAM BILATERAL BREAST  Next appt:  Visit date not found

## 2023-05-26 NOTE — Patient Instructions (Signed)
Preventive Care 50-50 Years Old, Female  Preventive care refers to lifestyle choices and visits with your health care provider that can promote health and wellness. Preventive care visits are also called wellness exams.  What can I expect for my preventive care visit?  Counseling  Your health care provider may ask you questions about your:  Medical history, including:  Past medical problems.  Family medical history.  Pregnancy history.  Current health, including:  Menstrual cycle.  Method of birth control.  Emotional well-being.  Home life and relationship well-being.  Sexual activity and sexual health.  Lifestyle, including:  Alcohol, nicotine or tobacco, and drug use.  Access to firearms.  Diet, exercise, and sleep habits.  Work and work Astronomer.  Sunscreen use.  Safety issues such as seatbelt and bike helmet use.  Physical exam  Your health care provider will check your:  Height and weight. These may be used to calculate your BMI (body mass index). BMI is a measurement that tells if you are at a healthy weight.  Waist circumference. This measures the distance around your waistline. This measurement also tells if you are at a healthy weight and may help predict your risk of certain diseases, such as type 2 diabetes and high blood pressure.  Heart rate and blood pressure.  Body temperature.  Skin for abnormal spots.  What immunizations do I need?    Vaccines are usually given at various ages, according to a schedule. Your health care provider will recommend vaccines for you based on your age, medical history, and lifestyle or other factors, such as travel or where you work.  What tests do I need?  Screening  Your health care provider may recommend screening tests for certain conditions. This may include:  Lipid and cholesterol levels.  Diabetes screening. This is done by checking your blood sugar (glucose) after you have not eaten for a while (fasting).  Pelvic exam and Pap test.  Hepatitis B test.  Hepatitis C  test.  HIV (human immunodeficiency virus) test.  STI (sexually transmitted infection) testing, if you are at risk.  Lung cancer screening.  Colorectal cancer screening.  Mammogram. Talk with your health care provider about when you should start having regular mammograms. This may depend on whether you have a family history of breast cancer.  BRCA-related cancer screening. This may be done if you have a family history of breast, ovarian, tubal, or peritoneal cancers.  Bone density scan. This is done to screen for osteoporosis.  Talk with your health care provider about your test results, treatment options, and if necessary, the need for more tests.  Follow these instructions at home:  Eating and drinking    Eat a diet that includes fresh fruits and vegetables, whole grains, lean protein, and low-fat dairy products.  Take vitamin and mineral supplements as recommended by your health care provider.  Do not drink alcohol if:  Your health care provider tells you not to drink.  You are pregnant, may be pregnant, or are planning to become pregnant.  If you drink alcohol:  Limit how much you have to 0-1 drink a day.  Know how much alcohol is in your drink. In the [place] one drink equals one 12 oz bottle of beer (355 mL), one 5 oz glass of wine (148 mL), or one 1 oz glass of hard liquor (44 mL).  Lifestyle  Brush your teeth every morning and night with fluoride toothpaste. Floss one time each day.  Exercise for at least  30 minutes 5 or more days each week.  Do not use any products that contain nicotine or tobacco. These products include cigarettes, chewing tobacco, and vaping devices, such as e-cigarettes. If you need help quitting, ask your health care provider.  Do not use drugs.  If you are sexually active, practice safe sex. Use a condom or other form of protection to prevent STIs.  If you do not wish to become pregnant, use a form of birth control. If you plan to become pregnant, see your health care provider for a  prepregnancy visit.  Take aspirin only as told by your health care provider. Make sure that you understand how much to take and what form to take. Work with your health care provider to find out whether it is safe and beneficial for you to take aspirin daily.  Find healthy ways to manage stress, such as:  Meditation, yoga, or listening to music.  Journaling.  Talking to a trusted person.  Spending time with friends and family.  Minimize exposure to UV radiation to reduce your risk of skin cancer.  Safety  Always wear your seat belt while driving or riding in a vehicle.  Do not drive:  If you have been drinking alcohol. Do not ride with someone who has been drinking.  When you are tired or distracted.  While texting.  If you have been using any mind-altering substances or drugs.  Wear a helmet and other protective equipment during sports activities.  If you have firearms in your house, make sure you follow all gun safety procedures.  Seek help if you have been physically or sexually abused.  What's next?  Visit your health care provider once a year for an annual wellness visit.  Ask your health care provider how often you should have your eyes and teeth checked.  Stay up to date on all vaccines.  This information is not intended to replace advice given to you by your health care provider. Make sure you discuss any questions you have with your health care provider.  Document Revised: 02/19/2021 Document Reviewed: 02/19/2021  Elsevier Patient Education  2024 [employer].

## 2023-05-28 ENCOUNTER — Ambulatory Visit
Admission: RE | Admit: 2023-05-28 | Discharge: 2023-05-28 | Disposition: A | Discharge status: Home / Self Care | Payer: No Typology Code available for payment source | Source: Ambulatory Visit | Attending: Adult Health | Admitting: Adult Health

## 2023-05-28 ENCOUNTER — Other Ambulatory Visit: Payer: Self-pay

## 2023-05-28 DIAGNOSIS — M25512 Pain in left shoulder: Secondary | ICD-10-CM

## 2023-05-28 DIAGNOSIS — Z0000 Encounter for general adult medical examination without abnormal findings: Secondary | ICD-10-CM

## 2023-05-29 LAB — HEMOGLOBIN A1C
Hgb A1c MFr Bld: 6 % of total Hgb — ABNORMAL HIGH (ref <5.7)
Mean Plasma Glucose: 126 mg/dL
eAG (mmol/L): 7 mmol/L

## 2023-05-29 LAB — LIPID PANEL
Cholesterol: 201 mg/dL — ABNORMAL HIGH (ref <200)
HDL: 60 mg/dL (ref >=50)
LDL Cholesterol (Calc): 122 mg/dL (calc) — ABNORMAL HIGH
Non-HDL Cholesterol (Calc): 141 mg/dL (calc) — ABNORMAL HIGH (ref <130)
Total CHOL/HDL Ratio: 3.4 (calc) (ref <5.0)
Triglycerides: 86 mg/dL (ref <150)

## 2023-05-29 LAB — COMPLETE METABOLIC PANEL WITH GFR
AG Ratio: 1.4 (calc) (ref 1.0–2.5)
ALT: 19 U/L (ref 6–29)
AST: 15 U/L (ref 10–35)
Albumin: 4.1 g/dL (ref 3.6–5.1)
Alkaline phosphatase (APISO): 67 U/L (ref 37–153)
BUN: 12 mg/dL (ref 7–25)
CO2: 26 mmol/L (ref 20–32)
Calcium: 9.2 mg/dL (ref 8.6–10.4)
Chloride: 105 mmol/L (ref 98–110)
Creat: 0.55 mg/dL (ref 0.50–1.03)
Globulin: 2.9 g/dL (calc) (ref 1.9–3.7)
Glucose, Bld: 91 mg/dL (ref 65–99)
Potassium: 3.8 mmol/L (ref 3.5–5.3)
Sodium: 139 mmol/L (ref 135–146)
Total Bilirubin: 0.6 mg/dL (ref 0.2–1.2)
Total Protein: 7 g/dL (ref 6.1–8.1)
eGFR: 112 mL/min/{1.73_m2} (ref >=60)

## 2023-05-29 LAB — CBC WITH DIFFERENTIAL/PLATELET
Absolute Monocytes: 536 cells/uL (ref 200–950)
Basophils Absolute: 17 cells/uL (ref 0–200)
Basophils Relative: 0.3 %
Eosinophils Absolute: 80 cells/uL (ref 15–500)
Eosinophils Relative: 1.4 %
HCT: 39 % (ref 35.0–45.0)
Hemoglobin: 12.6 g/dL (ref 11.7–15.5)
Lymphs Abs: 2383 cells/uL (ref 850–3900)
MCH: 31.2 pg (ref 27.0–33.0)
MCHC: 32.3 g/dL (ref 32.0–36.0)
MCV: 96.5 fL (ref 80.0–100.0)
MPV: 10 fL (ref 7.5–12.5)
Monocytes Relative: 9.4 %
Neutro Abs: 2685 cells/uL (ref 1500–7800)
Neutrophils Relative %: 47.1 %
Platelets: 278 10*3/uL (ref 140–400)
RBC: 4.04 10*6/uL (ref 3.80–5.10)
RDW: 12.5 % (ref 11.0–15.0)
Total Lymphocyte: 41.8 %
WBC: 5.7 10*3/uL (ref 3.8–10.8)

## 2023-06-01 NOTE — Progress Notes (Signed)
-   cholesterol and LDL are slightly elevated -  A1C 6.0, ranging as prediabetes, will need to do 150 minutes/week exercise and low carb diet, avoid processed foods, will re-check in 3 months -  electrolytes, liver enzymes, and kidney function normal -  CBC normal, no anemia

## 2023-06-03 ENCOUNTER — Ambulatory Visit (INDEPENDENT_AMBULATORY_CARE_PROVIDER_SITE_OTHER): Payer: Self-pay | Admitting: Adult Health

## 2023-06-03 ENCOUNTER — Encounter: Payer: Self-pay | Admitting: Adult Health

## 2023-06-03 VITALS — BP 125/88 | HR 81 | Temp 97.8°F | Resp 18 | Ht 61.0 in | Wt 146.6 lb

## 2023-06-03 DIAGNOSIS — R7303 Prediabetes: Secondary | ICD-10-CM

## 2023-06-03 DIAGNOSIS — Z2821 Immunization not carried out because of patient refusal: Secondary | ICD-10-CM

## 2023-06-03 DIAGNOSIS — F5101 Primary insomnia: Secondary | ICD-10-CM

## 2023-06-03 DIAGNOSIS — K5901 Slow transit constipation: Secondary | ICD-10-CM

## 2023-06-03 DIAGNOSIS — Z113 Encounter for screening for infections with a predominantly sexual mode of transmission: Secondary | ICD-10-CM

## 2023-06-03 DIAGNOSIS — M25512 Pain in left shoulder: Secondary | ICD-10-CM

## 2023-06-03 DIAGNOSIS — E782 Mixed hyperlipidemia: Secondary | ICD-10-CM

## 2023-06-03 MED ORDER — PREDNISONE 20 MG PO TABS: ORAL_TABLET | ORAL | 0 refills | Status: AC

## 2023-06-03 MED ORDER — MAGNESIUM 400 MG PO TABS: 1.0000 | ORAL_TABLET | Freq: Every day | ORAL | Status: AC

## 2023-06-03 MED ORDER — NAPROXEN 500 MG PO TABS
500.0000 mg | ORAL_TABLET | Freq: Two times a day (BID) | ORAL | Status: AC | PRN
Start: 2023-06-03 — End: ?

## 2023-06-03 MED ORDER — VITAMIN D3 25 MCG (1000 UT) PO CAPS
1000.0000 [IU] | ORAL_CAPSULE | Freq: Every day | ORAL | Status: AC
Start: 2023-06-03 — End: ?

## 2023-06-03 NOTE — Progress Notes (Signed)
[facility] clinic  Provider:  Kenard Gower DNP  Code Status:  Full Code  Goals of Care:     06/03/2023    8:45 AM  Advanced Directives  Does Patient Have a Medical Advance Directive? No  Would patient like information on creating a medical advance directive? No - Patient declined     Chief Complaint  Patient presents with   Follow-up    1 WEEK FU    Immunizations    Shingrix, Influenza, DTAP and Covid vaccine.   Quality Metric Gaps    Colonoscopy, Hepatitis C and Cervical Cancer Screening    HPI: Patient is a 52 y.o. female seen today for a 1-week follow up of lab result and left shoulder pain. She was accompanied today by a [place] interpreter.   Acute pain of left shoulder - seen at ED on 04/27/23 for left shoulder pain and was diagnosed with biceps tendinitis of left shoulder and rotator cuff syndrome, takes Naproxen PRN and does not take Zanaflex, pain rated as 8/10  Prediabetes - A1C 6.0, not on medication  Mixed hyperlipidemia  -  LDL 122, chol 201, not on statin  Primary insomnia - sleeps 6 hours at night, takes Magnesium 400 mg at HS    Past Medical History:  Diagnosis Date   Arthritis    Mild OA changes T6-T7   Dysmenorrhea    H. pylori infection    Migraines     Past Surgical History:  Procedure Laterality Date   NO PAST SURGERIES      No Known Allergies  Outpatient Encounter Medications as of 06/03/2023  Medication Sig   naproxen (NAPROSYN) 500 MG tablet Take 1 tablet (500 mg total) by mouth 2 (two) times daily.   sennosides-docusate sodium (SENOKOT-S) 8.6-50 MG tablet Take 2 tablets by mouth daily.   tiZANidine (ZANAFLEX) 4 MG tablet Take 1 tablet (4 mg total) by mouth every 8 (eight) hours as needed for muscle spasms.   No facility-administered encounter medications on file as of 06/03/2023.    Review of Systems:  Review of Systems  Constitutional:  Negative for appetite change, chills, fatigue and fever.  HENT:  Negative for congestion,  hearing loss, rhinorrhea and sore throat.   Eyes: Negative.   Respiratory:  Negative for cough, shortness of breath and wheezing.   Cardiovascular:  Negative for chest pain, palpitations and leg swelling.  Gastrointestinal:  Positive for constipation. Negative for abdominal pain, diarrhea, nausea and vomiting.  Genitourinary:  Negative for dysuria.  Musculoskeletal:  Positive for arthralgias. Negative for back pain and myalgias.       Left shoulder pain, 8/10 pain  Skin:  Negative for color change, rash and wound.  Neurological:  Negative for dizziness, weakness and headaches.  Psychiatric/Behavioral:  Negative for behavioral problems. The patient is not nervous/anxious.     Health Maintenance  Topic Date Due   Hepatitis C Screening  Never done   DTaP/Tdap/Td (1 - Tdap) Never done   Colonoscopy  Never done   Cervical Cancer Screening (HPV/Pap Cotest)  02/26/2020   Zoster Vaccines- Shingrix (1 of 2) Never done   INFLUENZA VACCINE  Never done   COVID-19 Vaccine (1 - 2023-24 season) Never done   MAMMOGRAM  02/20/2024   HIV Screening  Completed   HPV VACCINES  Aged Out    Physical Exam: Vitals:   06/03/23 0844  Height: 5\' 1"  (1.549 m)   Body mass index is 27.32 kg/m. Physical Exam Constitutional:  Appearance: Normal appearance.  HENT:     Head: Normocephalic and atraumatic.     Nose: Nose normal.     Mouth/Throat:     Mouth: Mucous membranes are moist.  Eyes:     Conjunctiva/sclera: Conjunctivae normal.  Cardiovascular:     Rate and Rhythm: Normal rate and regular rhythm.  Pulmonary:     Effort: Pulmonary effort is normal.     Breath sounds: Normal breath sounds.  Abdominal:     General: Bowel sounds are normal.     Palpations: Abdomen is soft.  Musculoskeletal:        General: Normal range of motion.     Cervical back: Normal range of motion.  Skin:    General: Skin is warm and dry.  Neurological:     General: No focal deficit present.     Mental Status:  She is alert and oriented to person, place, and time.  Psychiatric:        Mood and Affect: Mood normal.        Behavior: Behavior normal.        Thought Content: Thought content normal.        Judgment: Judgment normal.     Labs reviewed: Basic Metabolic Panel: Recent Labs    05/28/23 0836  NA 139  K 3.8  CL 105  CO2 26  GLUCOSE 91  BUN 12  CREATININE 0.55  CALCIUM 9.2   Liver Function Tests: Recent Labs    05/28/23 0836  AST 15  ALT 19  BILITOT 0.6  PROT 7.0   No results for input(s): "LIPASE", "AMYLASE" in the last 8760 hours. No results for input(s): "AMMONIA" in the last 8760 hours. CBC: Recent Labs    05/28/23 0836  WBC 5.7  NEUTROABS 2,685  HGB 12.6  HCT 39.0  MCV 96.5  PLT 278   Lipid Panel: Recent Labs    05/28/23 0836  CHOL 201*  HDL 60  LDLCALC 122*  TRIG 86  CHOLHDL 3.4   Lab Results  Component Value Date   HGBA1C 6.0 (H) 05/28/2023    Procedures since last visit: No results found.  Assessment/Plan  1. Acute pain of left shoulder -  rated pain as 8/10 - predniSONE (DELTASONE) 20 MG tablet; Take 3 tablets (60 mg total) by mouth daily with breakfast for 3 days, THEN 2 tablets (40 mg total) daily with breakfast for 3 days, THEN 1 tablet (20 mg total) daily with breakfast for 3 days.  Dispense: 18 tablet; Refill: 0 - Vitamin D, 25-hydroxy - Cholecalciferol (VITAMIN D3) 25 MCG (1000 UT) CAPS; Take 1 capsule (1,000 Units total) by mouth daily. - naproxen (NAPROSYN) 500 MG tablet; Take 1 tablet (500 mg total) by mouth 2 (two) times daily as needed. Take with food  2. Prediabetes -  instructed to have low carb diet and exercise at least 150 minutes/week - Hemoglobin A1C; Future - Lipid panel; Future  3. Mixed hyperlipidemia Lab Results  Component Value Date   CHOL 201 (H) 05/28/2023   HDL 60 05/28/2023   LDLCALC 122 (H) 05/28/2023   TRIG 86 05/28/2023   CHOLHDL 3.4 05/28/2023    -  instructed to increase vegetable intake, low  fat diet and exercise  4. Primary insomnia - Magnesium level - Magnesium 400 MG TABS; Take 1 tablet by mouth at bedtime.  5. Screen for STD (sexually transmitted disease) - Hep C Antibody  6. Slow transit constipation -  continue Senna-S  7. Flu vaccine  refused -  declined flu vaccine   Labs/tests ordered:  Hep C antibody and vitamin D level  Next appt:  Visit date not found

## 2023-06-11 ENCOUNTER — Other Ambulatory Visit: Payer: Self-pay | Admitting: Adult Health

## 2023-06-11 DIAGNOSIS — Z1212 Encounter for screening for malignant neoplasm of rectum: Secondary | ICD-10-CM

## 2023-06-11 DIAGNOSIS — Z1211 Encounter for screening for malignant neoplasm of colon: Secondary | ICD-10-CM

## 2023-06-15 NOTE — Progress Notes (Signed)
Left shoulder negative for fracture/dislocation or other abnormality.

## 2023-07-13 ENCOUNTER — Other Ambulatory Visit: Payer: Self-pay | Admitting: Obstetrics and Gynecology

## 2023-07-13 DIAGNOSIS — Z1231 Encounter for screening mammogram for malignant neoplasm of breast: Secondary | ICD-10-CM

## 2023-07-17 ENCOUNTER — Emergency Department (HOSPITAL_BASED_OUTPATIENT_CLINIC_OR_DEPARTMENT_OTHER)
Admission: EM | Admit: 2023-07-17 | Discharge: 2023-07-17 | Disposition: A | Discharge status: Home / Self Care | Payer: Self-pay | Attending: Emergency Medicine | Admitting: Emergency Medicine

## 2023-07-17 ENCOUNTER — Other Ambulatory Visit: Payer: Self-pay

## 2023-07-17 ENCOUNTER — Encounter (HOSPITAL_BASED_OUTPATIENT_CLINIC_OR_DEPARTMENT_OTHER): Payer: Self-pay

## 2023-07-17 DIAGNOSIS — M25512 Pain in left shoulder: Secondary | ICD-10-CM | POA: Insufficient documentation

## 2023-07-17 MED ORDER — PREDNISONE 20 MG PO TABS: ORAL_TABLET | ORAL | 0 refills | Status: DC

## 2023-07-17 NOTE — Discharge Instructions (Signed)
Haga un seguimiento con el ortopedista, Dr. Veda Canning, para una evaluacin adicional del dolor en el hombro izquierdo. Tome la prednison segn las indicaciones durante 6 [address]  [address]. Puede agregar Tylenol si es necesario.  Follow up with orthopedics, Dr. Veda Canning, for further evaluation of pain in left shoulder. Take the prednison as directed for 6 days. You can add Tylenol if needed.

## 2023-07-17 NOTE — ED Triage Notes (Signed)
In for eval of left shoulder pain x3 months 3 months. acute pain left shoulder, radiates down left arm, now pain to right shoulder and back of neck. Also reports intermittent sharp sensation in left ear.   Saw PCP and got Rx for naproxen and steroids and helped but as soon as the steroids worn off the pain came back. Dr. Referred for xray and has results. Report negative left shoulder xray.  Pain is more severe at night and while taking a shower. Unable to lift left arm well.  Reports old injury while doing exercise [employer] several years prior but reports it never felt the same after that.   Used interpretor [employer] # [phone]

## 2023-07-17 NOTE — ED Provider Notes (Signed)
[facility] EMERGENCY DEPARTMENT AT [facility] Provider Note   CSN: [id number] Arrival date & time: 07/17/23  1404     History  Chief Complaint  Patient presents with   Shoulder Pain    Sarah Andrews is a 52 y.o. female.  Patient to ED for evaluation of ongoing left shoulder pain x 3 months. Seen at Urgent Care 9/26 and by PCP 10/25. She brings negative xray results of the left shoulder. She reports the pain started in the left shoulder but sometimes extends to the right shoulder. Over the last one month she notes pain extending down the left arm, causing her to drop objects with her left hand. No numbness. When asked, she admits to pain also in the joints of the lower extremities.   The history is provided by the patient. A language interpreter was used.  Shoulder Pain      Home Medications Prior to Admission medications   Medication Sig Start Date End Date Taking? Authorizing Provider  predniSONE (DELTASONE) 20 MG tablet Take 3 on days 1 and 2 Take 2 on days 3 and 4 Take 1 on days 5 and 6 07/17/23  Yes Laquitta Dominski, Melvenia Beam, PA-C  Cholecalciferol (VITAMIN D3) 25 MCG (1000 UT) CAPS Take 1 capsule (1,000 Units total) by mouth daily. 06/03/23   Medina-Vargas, Monina C, NP  Magnesium 400 MG TABS Take 1 tablet by mouth at bedtime. 06/03/23   Medina-Vargas, Monina C, NP  naproxen (NAPROSYN) 500 MG tablet Take 1 tablet (500 mg total) by mouth 2 (two) times daily as needed. Take with food 06/03/23   Medina-Vargas, Monina C, NP  sennosides-docusate sodium (SENOKOT-S) 8.6-50 MG tablet Take 2 tablets by mouth daily. 05/26/23   Medina-Vargas, Monina C, NP      Allergies    Patient has no known allergies.    Review of Systems   Review of Systems  Physical Exam Updated Vital Signs BP 122/84   Pulse 68   Temp (!) 97.4 F (36.3 C)   Resp 15   Ht 4' 11.06" (1.5 m)   Wt 63.5 kg   SpO2 96%   BMI 28.22 kg/m  Physical Exam Vitals and nursing note reviewed.  Constitutional:       Appearance: Normal appearance.  Neck:     Comments: No midline cervical or paracervical tenderness.  Cardiovascular:     Rate and Rhythm: Normal rate.  Pulmonary:     Effort: Pulmonary effort is normal.  Musculoskeletal:     Cervical back: Normal range of motion and neck supple.     Comments: Left shoulder is generally tender circumferentially. No swelling, redness or warmth of the joint. Painful abduction. 5/5 grip strength that is equal in both hands. Normal bicep and tricep strength of the left UE.   Skin:    General: Skin is warm and dry.     Findings: No erythema.  Neurological:     Mental Status: She is alert and oriented to person, place, and time.     Sensory: No sensory deficit.     ED Results / Procedures / Treatments   Labs (all labs ordered are listed, but only abnormal results are displayed) Labs Reviewed - No data to display  EKG None  Radiology No results found.  Procedures Procedures    Medications Ordered in ED Medications - No data to display  ED Course/ Medical Decision Making/ A&P Clinical Course as of 07/17/23 1531  Sat Jul 17, 2023  1530 Patient with left shoulder  joint pain x 3 months, with negative plain film xray report. Also admits to other joint pain. Do not suspect septic arthritis. Discussed referral to orthopedics for further evaluation, with PCP follow up for diffuse joint pain. Will Rx prednisone dose pack.  [SU]    Clinical Course User Index [SU] Elpidio Anis, PA-C                                 Medical Decision Making          Final Clinical Impression(s) / ED Diagnoses Final diagnoses:  Pain in joint of left shoulder    Rx / DC Orders ED Discharge Orders          Ordered    predniSONE (DELTASONE) 20 MG tablet        07/17/23 1503              Elpidio Anis, PA-C 07/17/23 1532    Rolan Bucco, MD 07/18/23 0725

## 2023-07-19 ENCOUNTER — Telehealth: Payer: Self-pay

## 2023-07-19 NOTE — Transitions of Care (Post Inpatient/ED Visit) (Signed)
   07/19/2023  Name: Sarah Andrews MRN: [id number] DOB: Feb 09, 1971  Today's TOC FU Call Status: Today's TOC FU Call Status:: Unsuccessful Call (1st Attempt) Unsuccessful Call (1st Attempt) Date: 07/19/23  Attempted to reach the patient regarding the most recent Inpatient/ED visit. Called patient and phone kept ringing. Unable to leave voicemail.   Follow Up Plan: Additional outreach attempts will be made to reach the patient to complete the Transitions of Care (Post Inpatient/ED visit) call.   Signature : Coriann Brouhard.D/RMA

## 2023-07-21 NOTE — Transitions of Care (Post Inpatient/ED Visit) (Signed)
   07/21/2023  Name: Sarah Andrews MRN: [id number] DOB: [birth year]  Today's TOC FU Call Status: Today's TOC FU Call Status:: Unsuccessful Call (2nd Attempt) Unsuccessful Call (1st Attempt) Date: 07/19/23 Unsuccessful Call (2nd Attempt) Date: 07/21/23  Attempted to reach the patient regarding the most recent Inpatient/ED visit. Called patient and no answer. Voicemail was left with office call back number.    Follow Up Plan: Additional outreach attempts will be made to reach the patient to complete the Transitions of Care (Post Inpatient/ED visit) call.   Signature: Jurni Cesaro.D/RMA

## 2023-07-26 NOTE — Transitions of Care (Post Inpatient/ED Visit) (Signed)
   07/26/2023  Name: Sarah Andrews MRN: [id number] DOB: November 06, 1970  Today's TOC FU Call Status: Today's TOC FU Call Status:: Unsuccessful Call (3rd Attempt) Unsuccessful Call (1st Attempt) Date: 07/19/23 Unsuccessful Call (2nd Attempt) Date: 07/21/23 Unsuccessful Call (3rd Attempt) Date: 07/26/23  Attempted to reach the patient regarding the most recent Inpatient/ED visit. Called patient and no answer. Voicemail was left with office call back number.    Follow Up Plan: No further outreach attempts will be made at this time. We have been unable to contact the patient.  Signature: Rashaad Hallstrom.D/RMA

## 2023-08-26 ENCOUNTER — Encounter: Payer: Self-pay | Admitting: Internal Medicine

## 2023-09-02 ENCOUNTER — Other Ambulatory Visit: Payer: Self-pay

## 2023-09-02 DIAGNOSIS — R7303 Prediabetes: Secondary | ICD-10-CM

## 2023-09-10 LAB — COLOGUARD: COLOGUARD: NEGATIVE

## 2023-09-30 ENCOUNTER — Ambulatory Visit: Payer: Self-pay | Admitting: *Deleted

## 2023-09-30 ENCOUNTER — Ambulatory Visit
Admission: RE | Admit: 2023-09-30 | Discharge: 2023-09-30 | Disposition: A | Discharge status: Home / Self Care | Payer: No Typology Code available for payment source | Source: Ambulatory Visit | Attending: Obstetrics and Gynecology | Admitting: Obstetrics and Gynecology

## 2023-09-30 VITALS — BP 116/86 | Wt 142.0 lb

## 2023-09-30 DIAGNOSIS — Z1231 Encounter for screening mammogram for malignant neoplasm of breast: Secondary | ICD-10-CM

## 2023-09-30 DIAGNOSIS — Z01419 Encounter for gynecological examination (general) (routine) without abnormal findings: Secondary | ICD-10-CM

## 2023-09-30 NOTE — Patient Instructions (Signed)
Explained breast self awareness with Raquel James. Pap smear completed today. Let patient know that follow up for today's Pap smear will be based on the result of today's Pap smear due to her history of an abnormal Pap smear. Referred patient to the [facility] for a screening mammogram on mobile unit. Appointment scheduled Thursday, September 30, 2023 at 1530. Patient aware of appointment and will be there. Let patient know will follow up with her within the next couple weeks with results of Pap smear by letter or phone. Informed patient that the Breast Center will follow up with her within the next couple of weeks with results of her mammogram by letter or phone. Raquel James verbalized understanding.  Katherinne Mofield, Kathaleen Maser, RN 3:00 PM

## 2023-09-30 NOTE — Progress Notes (Signed)
Sarah Andrews is a 53 y.o. G84P1011 female who presents to [facility] clinic today with no complaints.    Pap Smear: Pap smear completed today. Last Pap smear was in May 2023 at the [facility] clinic and was normal per patient. Patient has history of an abnormal Pap smear 12/25/2015 that was LGSIL with positive HPV that a colposcopy was completed for follow up that was benign. Per patient her last Pap smear was her second normal Pap smear since colposcopy. Last Pap smear result is not available in Epic. Previous Pap smear result is in Epic.   Physical exam: Breasts Breasts symmetrical. No skin abnormalities bilateral breasts. No nipple retraction bilateral breasts. No nipple discharge bilateral breasts. No lymphadenopathy. No lumps palpated bilateral breasts. No complaints of pain or tenderness on exam.      MS DIGITAL DIAG TOMO UNI RIGHT Result Date: 03/12/2022 CLINICAL DATA:  53 year old female presenting as a recall from screening for possible right breast asymmetry. EXAM: DIGITAL DIAGNOSTIC UNILATERAL RIGHT MAMMOGRAM WITH TOMOSYNTHESIS AND CAD; ULTRASOUND RIGHT BREAST LIMITED TECHNIQUE: Right digital diagnostic mammography and breast tomosynthesis was performed. The images were evaluated with computer-aided detection.; Targeted ultrasound examination of the right breast was performed COMPARISON:  Previous exam(s). ACR Breast Density Category c: The breast tissue is heterogeneously dense, which may obscure small masses. FINDINGS: Mammogram: Spot compression tomosynthesis and full true lateral tomosynthesis views of the right breast were performed for a questioned asymmetry best seen on the cc view in the central superior right breast. On the additional imaging there is a probable obscured mass measuring approximately 1.2 cm in the central superior right breast. There is also an oval circumscribed mass on the cc spot imaging in the outer breast measuring approximately 0.6 cm.  Ultrasound: Targeted ultrasound is performed in the right breast at 12 o'clock 3 cm from the nipple demonstrating an oval circumscribed anechoic mass with small amount of internal debris measuring 1.2 x 0.4 x 0.8 cm, consistent with a benign cyst. At 9 o'clock 3 cm from the nipple there is an oval circumscribed anechoic mass measuring 0.7 x 0.4 x 0.7 cm, consistent with a benign cyst. This corresponds to the mammographic finding. There are additional scattered tiny simple cysts and a cluster of cysts at 9 o'clock 5 cm from the nipple. No suspicious solid mass. IMPRESSION: Benign fibrocystic changes in the superior central and outer right breast. RECOMMENDATION: Screening mammogram in one year.(Code:[id number]-01Y) I have discussed the findings and recommendations with the patient. If applicable, a reminder letter will be sent to the patient regarding the next appointment. BI-RADS CATEGORY  2: Benign. Electronically Signed   By: Emmaline Kluver M.D.   On: 03/12/2022 16:28  MS DIGITAL SCREENING TOMO BILATERAL Result Date: 02/23/2022 CLINICAL DATA:  Screening. EXAM: DIGITAL SCREENING BILATERAL MAMMOGRAM WITH TOMOSYNTHESIS AND CAD TECHNIQUE: Bilateral screening digital craniocaudal and mediolateral oblique mammograms were obtained. Bilateral screening digital breast tomosynthesis was performed. The images were evaluated with computer-aided detection. COMPARISON:  Previous exam(s). ACR Breast Density Category c: The breast tissue is heterogeneously dense, which may obscure small masses. FINDINGS: In the right breast, a possible asymmetry warrants further evaluation. In the left breast, no findings suspicious for malignancy. IMPRESSION: Further evaluation is suggested for possible asymmetry in the right breast. RECOMMENDATION: Diagnostic mammogram and possibly ultrasound of the right breast. (Code:[id number]) The patient will be contacted regarding the findings, and additional imaging will be scheduled. BI-RADS CATEGORY  0:  Incomplete. Need additional imaging evaluation and/or prior mammograms  for comparison. Electronically Signed   By: Baird Lyons M.D.   On: 02/23/2022 09:16   Pelvic/Bimanual Ext Genitalia No lesions, no swelling and no discharge observed on external genitalia.        Vagina Vagina pink and normal texture. No lesions or discharge observed in vagina.        Cervix Cervix is present. Cervix pink and of normal texture. No discharge observed.    Uterus Uterus is present and palpable. Uterus slightly prolapsed and normal size.        Adnexae Bilateral ovaries present and palpable. No tenderness on palpation.         Rectovaginal No rectal exam completed today since patient had no rectal complaints. No skin abnormalities observed on exam.     Smoking History: Patient is a former smoker that quit 04/07/2013.    Patient Navigation: Patient education provided. Access to services provided for patient through [place] program. Spanish interpreter Sarah Andrews from [facility] provided.   Colorectal Cancer Screening: Per patient has never had colonoscopy completed. Patient completed a [facility] 09/04/2023 that was negative. No complaints today.    Breast and Cervical Cancer Risk Assessment: Patient does not have family history of breast cancer, known genetic mutations, or radiation treatment to the chest before age 51. Patient has history of cervical dysplasia. Patient has no history of being immunocompromised or DES exposure in-utero.   Risk Scores as of Encounter on 09/30/2023     Sarah Andrews           5-year 1.34%   Lifetime 13.1%            Last calculated by Sarah Andrews, CMA on 09/30/2023 at  2:56 PM        A: [facility]CP exam with pap smear No complaints.  P: Referred patient to the [facility] for a screening mammogram on mobile unit. Appointment scheduled Thursday, September 30, 2023 at 1530.  [name], RN 09/30/2023 3:00 PM

## 2023-10-04 LAB — CYTOLOGY - PAP
Comment: NEGATIVE
Diagnosis: NEGATIVE
High risk HPV: NEGATIVE

## 2024-05-31 IMAGING — MG MM DIGITAL SCREENING BILAT W/ TOMO AND CAD
8 series · 9 of 24 positions shown · non-contrast
Comparison: Previous exam(s).

CLINICAL DATA: Screening.

EXAM:
DIGITAL SCREENING BILATERAL MAMMOGRAM WITH TOMOSYNTHESIS AND CAD
TECHNIQUE: Bilateral screening digital craniocaudal and mediolateral oblique
mammograms were obtained. Bilateral screening digital breast
tomosynthesis was performed. The images were evaluated with
computer-aided detection.

[L MLO synth-2D]
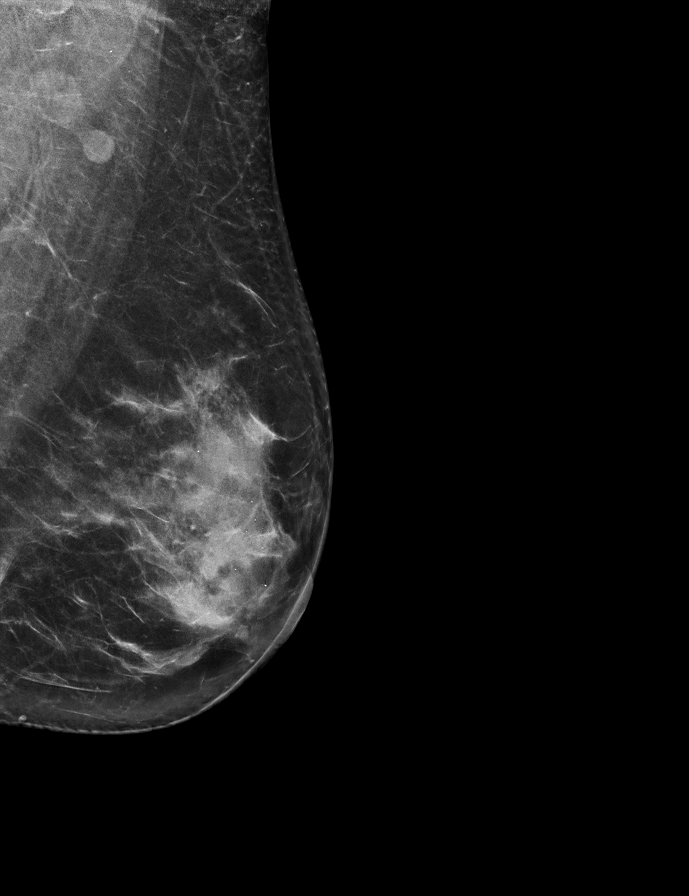

[R CC synth-2D]
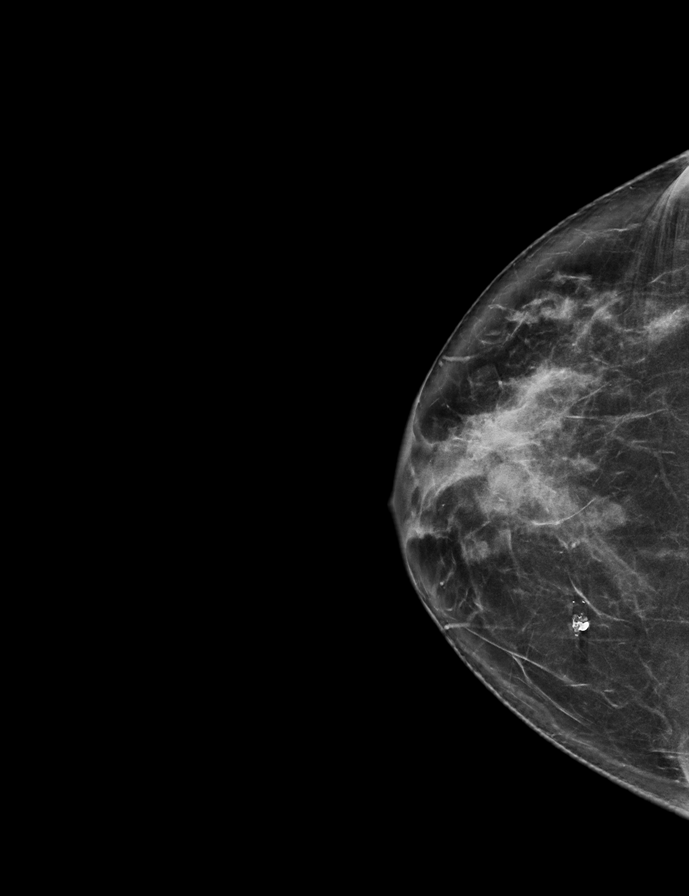

[R MLO synth-2D]
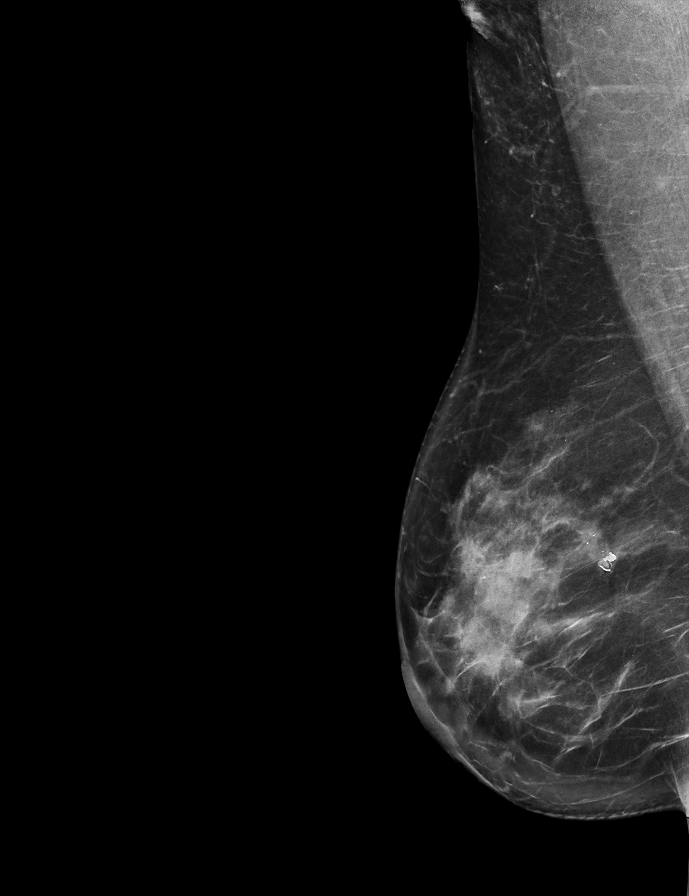

[L CC synth-2D]
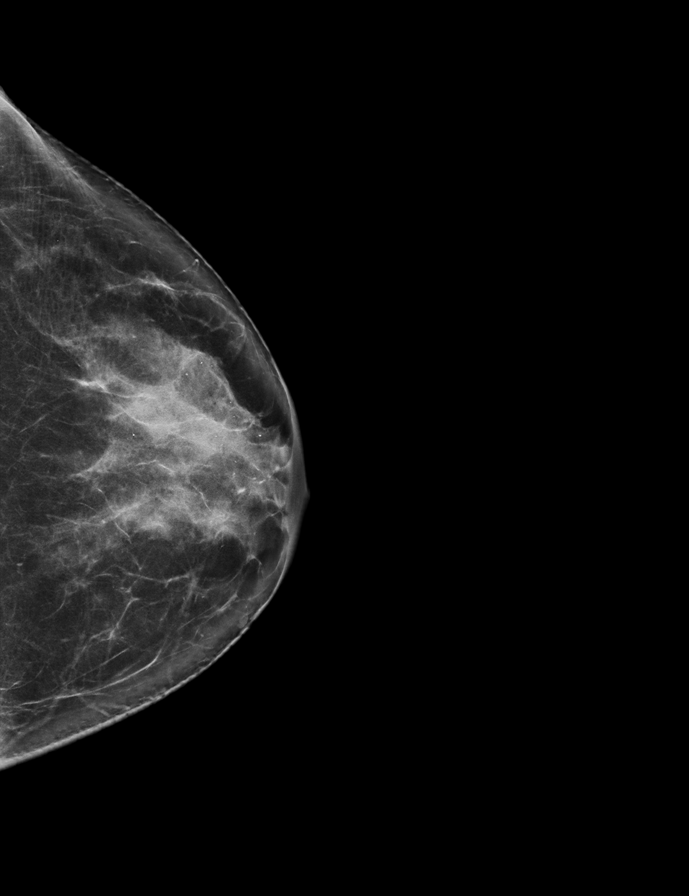

[R CC tomo · 2 of 71 frames shown]
[frame 23/71]
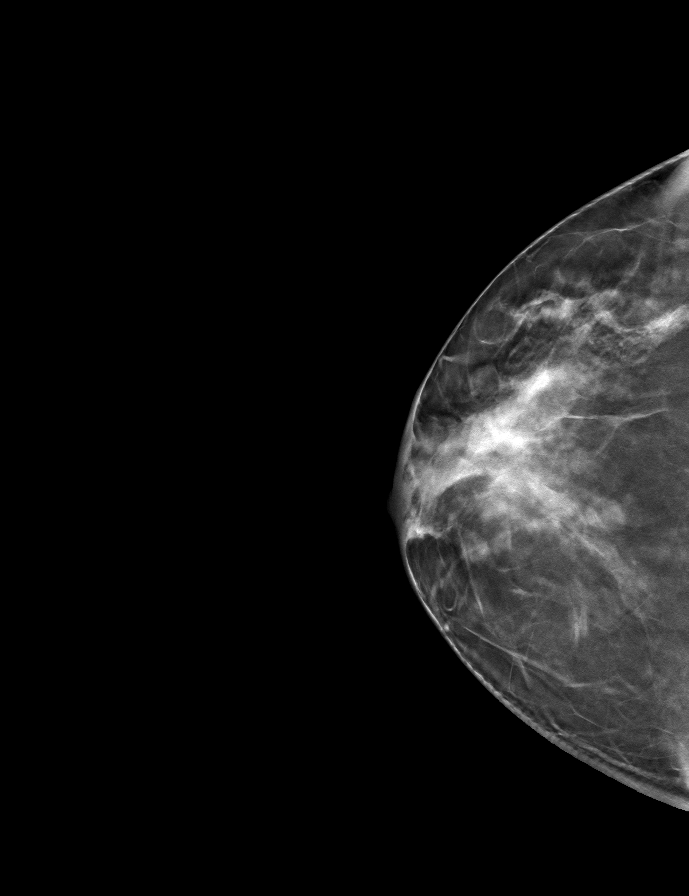
[frame 36/71]
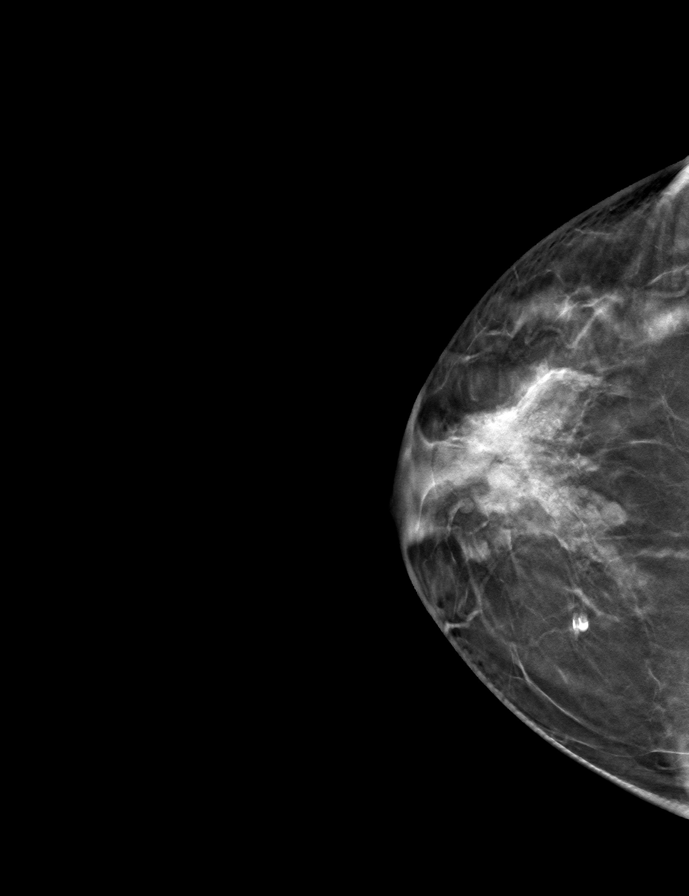

[R MLO tomo · tomo slice 40/79.0]
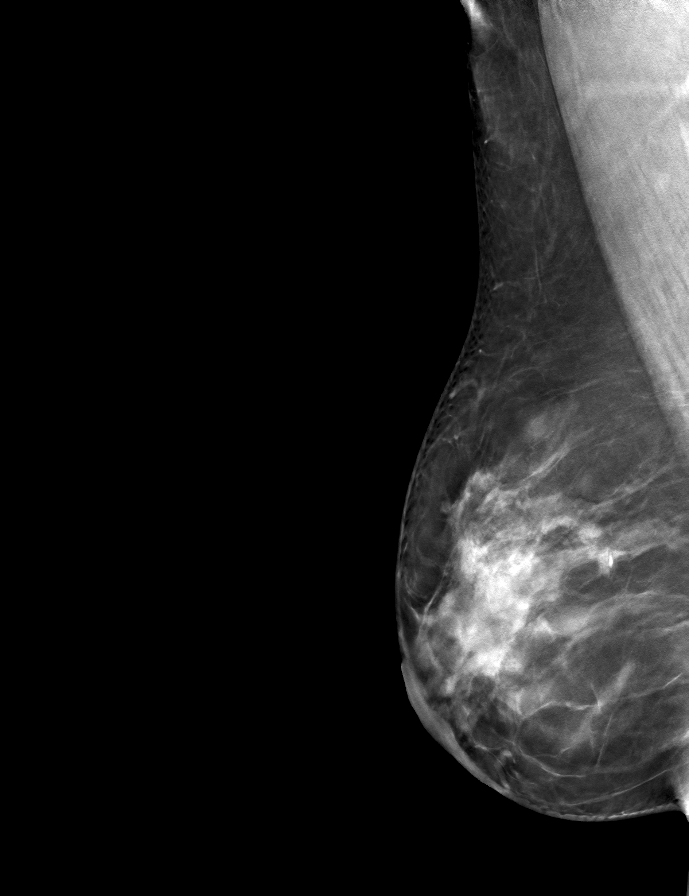

[L MLO tomo · tomo slice 39/77.0]
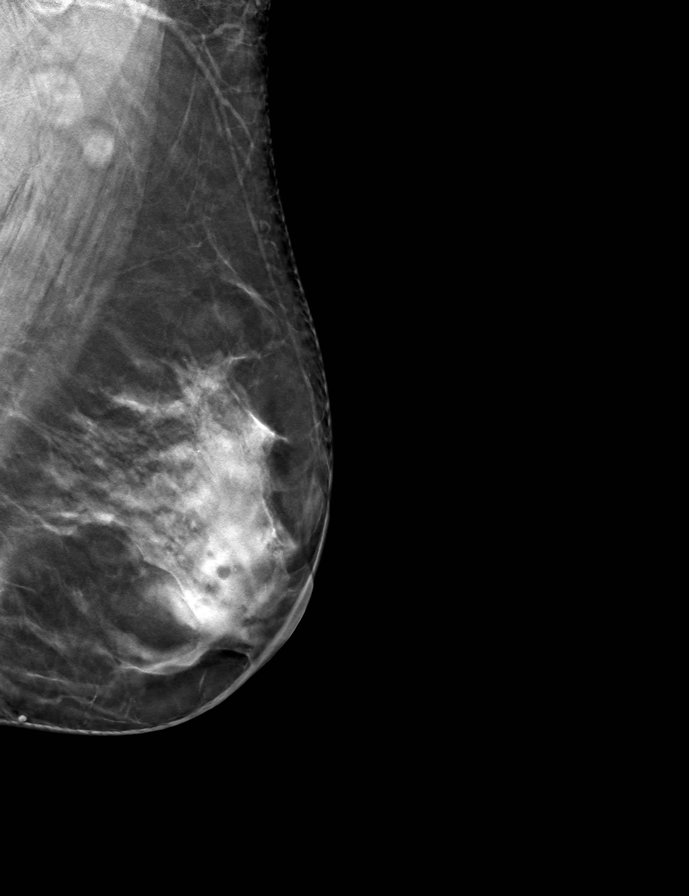

[L CC tomo · tomo slice 36/71.0]
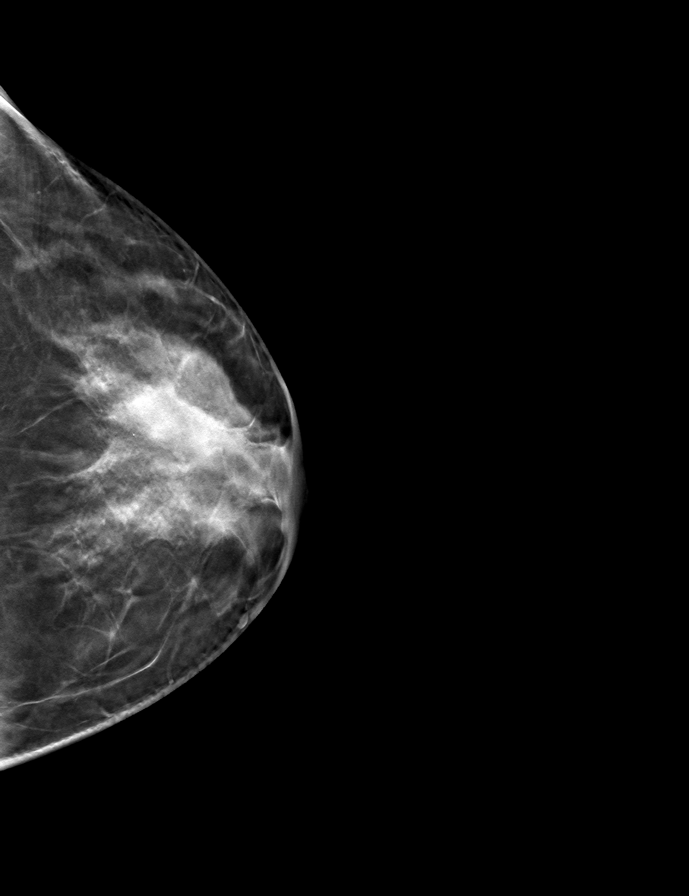

[9 of 24 positions shown; findings below may reference images not displayed]

ACR Breast Density Category c: The breast tissue is heterogeneously
dense, which may obscure small masses.
FINDINGS: In the right breast, a possible asymmetry warrants further
evaluation. In the left breast, no findings suspicious for
malignancy.
IMPRESSION: Further evaluation is suggested for possible asymmetry in the right
breast.

RECOMMENDATION:
Diagnostic mammogram and possibly ultrasound of the right breast.
(Code:[id number])

The patient will be contacted regarding the findings, and additional
imaging will be scheduled.

BI-RADS CATEGORY  0: Incomplete. Need additional imaging evaluation
and/or prior mammograms for comparison.
